# Patient Record
Sex: Male | Born: 1970 | Race: White | Hispanic: Refuse to answer | Marital: Single | Smoking: Former smoker
Health system: Southern US, Community
[De-identification: ages and names within clinical notes are randomized; demographics above are authoritative.]

## PROBLEM LIST (undated history)

## (undated) DIAGNOSIS — Z21 Asymptomatic human immunodeficiency virus [HIV] infection status: Secondary | ICD-10-CM

## (undated) DIAGNOSIS — E291 Testicular hypofunction: Secondary | ICD-10-CM

## (undated) DIAGNOSIS — B20 Human immunodeficiency virus [HIV] disease: Secondary | ICD-10-CM

## (undated) DIAGNOSIS — T7840XA Allergy, unspecified, initial encounter: Secondary | ICD-10-CM

## (undated) DIAGNOSIS — E785 Hyperlipidemia, unspecified: Secondary | ICD-10-CM

## (undated) HISTORY — DX: Human immunodeficiency virus (HIV) disease: B20

## (undated) HISTORY — DX: Hyperlipidemia, unspecified: E78.5

## (undated) HISTORY — DX: Asymptomatic human immunodeficiency virus (hiv) infection status: Z21

## (undated) HISTORY — DX: Testicular hypofunction: E29.1

## (undated) HISTORY — DX: Allergy, unspecified, initial encounter: T78.40XA

---

## 2007-02-21 ENCOUNTER — Ambulatory Visit: Payer: Self-pay | Admitting: Family Medicine

## 2007-02-24 ENCOUNTER — Ambulatory Visit: Payer: Self-pay | Admitting: Family Medicine

## 2007-06-01 ENCOUNTER — Ambulatory Visit: Payer: Self-pay | Admitting: Family Medicine

## 2007-07-04 ENCOUNTER — Ambulatory Visit: Payer: Self-pay | Admitting: Family Medicine

## 2007-12-11 ENCOUNTER — Ambulatory Visit: Payer: Self-pay | Admitting: Family Medicine

## 2008-01-03 ENCOUNTER — Ambulatory Visit: Payer: Self-pay | Admitting: Family Medicine

## 2008-07-19 ENCOUNTER — Ambulatory Visit: Payer: Self-pay | Admitting: Family Medicine

## 2009-08-07 ENCOUNTER — Ambulatory Visit: Payer: Self-pay | Admitting: Family Medicine

## 2010-06-04 ENCOUNTER — Ambulatory Visit: Admit: 2010-06-04 | Payer: Self-pay | Admitting: Family Medicine

## 2010-07-17 ENCOUNTER — Encounter (INDEPENDENT_AMBULATORY_CARE_PROVIDER_SITE_OTHER): Payer: BC Managed Care – PPO | Admitting: Family Medicine

## 2010-07-17 DIAGNOSIS — B20 Human immunodeficiency virus [HIV] disease: Secondary | ICD-10-CM

## 2010-07-17 DIAGNOSIS — Z0000 Encounter for general adult medical examination without abnormal findings: Secondary | ICD-10-CM

## 2010-07-17 DIAGNOSIS — J309 Allergic rhinitis, unspecified: Secondary | ICD-10-CM

## 2010-07-17 DIAGNOSIS — E291 Testicular hypofunction: Secondary | ICD-10-CM

## 2011-02-07 ENCOUNTER — Other Ambulatory Visit: Payer: Self-pay | Admitting: Family Medicine

## 2011-02-08 NOTE — Telephone Encounter (Signed)
Is this ok?

## 2011-03-10 ENCOUNTER — Other Ambulatory Visit: Payer: Self-pay | Admitting: Family Medicine

## 2011-03-10 NOTE — Telephone Encounter (Signed)
Patient was last seen on 07/17/10.

## 2011-03-10 NOTE — Telephone Encounter (Signed)
His medication was renewed but he needs an appointment

## 2011-03-12 NOTE — Telephone Encounter (Signed)
Pt called and Vernona Rieger set up an ov

## 2011-03-29 ENCOUNTER — Encounter: Payer: Self-pay | Admitting: Family Medicine

## 2011-03-30 ENCOUNTER — Encounter: Payer: Self-pay | Admitting: Family Medicine

## 2011-03-30 ENCOUNTER — Ambulatory Visit (INDEPENDENT_AMBULATORY_CARE_PROVIDER_SITE_OTHER): Payer: BC Managed Care – PPO | Admitting: Family Medicine

## 2011-03-30 VITALS — BP 130/84 | HR 60 | Wt 219.0 lb

## 2011-03-30 DIAGNOSIS — Z23 Encounter for immunization: Secondary | ICD-10-CM

## 2011-03-30 DIAGNOSIS — B20 Human immunodeficiency virus [HIV] disease: Secondary | ICD-10-CM | POA: Insufficient documentation

## 2011-03-30 DIAGNOSIS — Z566 Other physical and mental strain related to work: Secondary | ICD-10-CM

## 2011-03-30 DIAGNOSIS — Z5689 Other problems related to employment: Secondary | ICD-10-CM

## 2011-03-30 DIAGNOSIS — Z79899 Other long term (current) drug therapy: Secondary | ICD-10-CM

## 2011-03-30 DIAGNOSIS — Z209 Contact with and (suspected) exposure to unspecified communicable disease: Secondary | ICD-10-CM

## 2011-03-30 LAB — COMPREHENSIVE METABOLIC PANEL
CO2: 26 mEq/L (ref 19–32)
Calcium: 9.1 mg/dL (ref 8.4–10.5)
Chloride: 105 mEq/L (ref 96–112)
Creat: 0.91 mg/dL (ref 0.50–1.35)
Glucose, Bld: 104 mg/dL — ABNORMAL HIGH (ref 70–99)
Total Bilirubin: 0.2 mg/dL — ABNORMAL LOW (ref 0.3–1.2)
Total Protein: 6.5 g/dL (ref 6.0–8.3)

## 2011-03-30 NOTE — Progress Notes (Signed)
  Subjective:    Patient ID: Charles Ho, male    DOB: 05/15/1971, 40 y.o.   MRN: [id number]  HPI He is here for medication recheck. He continues on medications listed in the chart. He is doing fairly well on these medications. He also has concerns about STD exposure several months ago. He is having no discharge or dysuria. 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 At this time is not interested in coming off of his psychotropic medications. He has no other concerns or complaints specifically fever, chills, cough, congestion or weight change.   Review of Systems Negative except as above    Objective:   Physical Exam alert and in no distress. Tympanic membranes and canals are normal. Throat is clear. Tonsils are normal. Neck is supple without adenopathy or thyromegaly. Cardiac exam shows a regular sinus rhythm without murmurs or gallops. Lungs are clear to auscultation. Abdominal exam shows no masses or tenderness.        Assessment & Plan:   1. Human immunodeficiency virus (HIV) disease  T-helper cells (CD4) count, Comprehensive metabolic panel, HIV 1 RNA quant-no reflex-bld  2. Contact with or exposure to unspecified communicable disease  RPR  3. Stress at work    4. Encounter for long-term (current) use of other medications     Blood screening is listed above. We also discussed the stresses she is under and he seems to be handling this fairly well. Here in several months.

## 2011-03-31 LAB — T-HELPER CELLS (CD4) COUNT (NOT AT ARMC)
Absolute CD4: 1060 /uL (ref 381–1469)
CD4 T Helper %: 43 % (ref 32–62)

## 2011-04-01 LAB — HIV-1 RNA QUANT-NO REFLEX-BLD: HIV 1 RNA Quant: NOT DETECTED copies/mL (ref <20)

## 2011-04-11 ENCOUNTER — Other Ambulatory Visit: Payer: Self-pay | Admitting: Family Medicine

## 2011-04-12 NOTE — Telephone Encounter (Signed)
Is this okay?

## 2011-08-09 ENCOUNTER — Other Ambulatory Visit: Payer: Self-pay | Admitting: Family Medicine

## 2011-08-09 NOTE — Telephone Encounter (Signed)
Renew all of these

## 2011-08-09 NOTE — Telephone Encounter (Signed)
Is this ok?

## 2011-08-10 NOTE — Telephone Encounter (Signed)
Is this ok?

## 2011-10-28 ENCOUNTER — Telehealth: Payer: Self-pay | Admitting: Family Medicine

## 2011-10-28 NOTE — Telephone Encounter (Signed)
I SPOKE WITH PT AT THE PHARMACY & ADVISED OF ABOVE.  HE STATED HE HAS A BOTTLE FROM A COUPLE YEARS AGO THAT HE WAS USING.  ADVISED PT THAT I DIDN'T RECOMMEND HIM USING A INJECTION THAT WAS SEVERAL YEARS OLD & OFFERED AN APPT.  HE WILL CALL BACK NEXT WEEK & SCHEDULE APPT WITH JCL-LM

## 2012-02-03 ENCOUNTER — Other Ambulatory Visit: Payer: Self-pay | Admitting: Family Medicine

## 2012-02-04 ENCOUNTER — Telehealth: Payer: Self-pay | Admitting: Internal Medicine

## 2012-02-04 NOTE — Telephone Encounter (Signed)
Renew his medicine but make sure he has an appointment soon

## 2012-02-04 NOTE — Telephone Encounter (Signed)
Is this ok?

## 2012-02-04 NOTE — Telephone Encounter (Signed)
He needs to come in for an exam but keeping on his meds til then

## 2012-03-06 ENCOUNTER — Other Ambulatory Visit: Payer: Self-pay | Admitting: Family Medicine

## 2012-03-27 ENCOUNTER — Other Ambulatory Visit: Payer: Self-pay | Admitting: Family Medicine

## 2012-03-27 ENCOUNTER — Ambulatory Visit (INDEPENDENT_AMBULATORY_CARE_PROVIDER_SITE_OTHER): Payer: BC Managed Care – PPO | Admitting: Family Medicine

## 2012-03-27 ENCOUNTER — Other Ambulatory Visit: Payer: Self-pay

## 2012-03-27 ENCOUNTER — Encounter: Payer: Self-pay | Admitting: Family Medicine

## 2012-03-27 VITALS — BP 112/70 | HR 78 | Ht 70.0 in | Wt 224.0 lb

## 2012-03-27 DIAGNOSIS — B20 Human immunodeficiency virus [HIV] disease: Secondary | ICD-10-CM

## 2012-03-27 DIAGNOSIS — J309 Allergic rhinitis, unspecified: Secondary | ICD-10-CM | POA: Insufficient documentation

## 2012-03-27 DIAGNOSIS — F341 Dysthymic disorder: Secondary | ICD-10-CM

## 2012-03-27 DIAGNOSIS — E669 Obesity, unspecified: Secondary | ICD-10-CM

## 2012-03-27 DIAGNOSIS — Z23 Encounter for immunization: Secondary | ICD-10-CM

## 2012-03-27 DIAGNOSIS — Z0000 Encounter for general adult medical examination without abnormal findings: Secondary | ICD-10-CM

## 2012-03-27 DIAGNOSIS — Z79899 Other long term (current) drug therapy: Secondary | ICD-10-CM

## 2012-03-27 LAB — CBC WITH DIFFERENTIAL/PLATELET
Basophils Absolute: 0 10*3/uL (ref 0.0–0.1)
Basophils Relative: 1 % (ref 0–1)
Eosinophils Absolute: 0.3 10*3/uL (ref 0.0–0.7)
Eosinophils Relative: 5 % (ref 0–5)
Lymphocytes Relative: 46 % (ref 12–46)
MCH: 30.1 pg (ref 26.0–34.0)
MCV: 83.4 fL (ref 78.0–100.0)
Platelets: 200 10*3/uL (ref 150–400)
RDW: 12.6 % (ref 11.5–15.5)
WBC: 6.3 10*3/uL (ref 4.0–10.5)

## 2012-03-27 LAB — HEMOCCULT GUIAC POC 1CARD (OFFICE)

## 2012-03-27 LAB — COMPREHENSIVE METABOLIC PANEL
ALT: 28 U/L (ref 0–53)
AST: 21 U/L (ref 0–37)
Alkaline Phosphatase: 55 U/L (ref 39–117)
Creat: 0.99 mg/dL (ref 0.50–1.35)
Total Bilirubin: 0.3 mg/dL (ref 0.3–1.2)

## 2012-03-27 LAB — LIPID PANEL
HDL: 47 mg/dL (ref >39)
Total CHOL/HDL Ratio: 3.7 Ratio

## 2012-03-27 MED ORDER — FLUOXETINE HCL 20 MG PO CAPS: 20.0000 mg | ORAL_CAPSULE | Freq: Every day | ORAL | Status: DC

## 2012-03-27 MED ORDER — EMTRICITABINE-TENOFOVIR DF 200-300 MG PO TABS: 1.0000 | ORAL_TABLET | Freq: Every day | ORAL | Status: DC

## 2012-03-27 MED ORDER — EFAVIRENZ 600 MG PO TABS: 600.0000 mg | ORAL_TABLET | Freq: Every day | ORAL | Status: DC

## 2012-03-27 MED ORDER — FLUTICASONE PROPIONATE 50 MCG/ACT NA SUSP: 2.0000 | Freq: Every day | NASAL | Status: DC

## 2012-03-27 MED ORDER — BUPROPION HCL ER (XL) 300 MG PO TB24: 300.0000 mg | ORAL_TABLET | Freq: Every day | ORAL | Status: DC

## 2012-03-27 NOTE — Progress Notes (Signed)
Subjective:    Patient ID: Charles Ho, male    DOB: 04-05-1971, 41 y.o.   MRN: [id number]  HPI He is here for a complete examination. 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 He does work long hours and is very frustrated not with teaching but with the rules and regulations that he has to deal with. He has been on Prozac and Wellbutrin but recently stopped the Wellbutrin. He has not yet gotten involved in counseling. He continues on his HIV medications and is having no difficulty with them. His allergies are under good control.   Review of Systems  Constitutional: Negative.   HENT: Negative.   Respiratory: Negative.   Cardiovascular: Negative.   Gastrointestinal: Negative.   Genitourinary: Negative.   Musculoskeletal: Negative.   Skin: Negative.   Neurological: Negative.   Hematological: Negative.   Psychiatric/Behavioral: Positive for dysphoric mood.       Objective:   Physical Exam BP 112/70  Pulse 78  Ht 5\' 10"  (1.778 m)  Wt 224 lb (101.606 kg)  BMI 32.14 kg/m2  SpO2 99%  General Appearance:    Alert, cooperative, no distress, appears stated age  Head:    Normocephalic, without obvious abnormality, atraumatic  Eyes:    PERRL, conjunctiva/corneas clear, EOM's intact, fundi    benign  Ears:    Normal TM's and external ear canals  Nose:   Nares normal, mucosa normal, no drainage or sinus   tenderness  Throat:   Lips, mucosa, and tongue normal; teeth and gums normal  Neck:   Supple, no lymphadenopathy;  thyroid:  no   enlargement/tenderness/nodules; no carotid   bruit or JVD  Back:    Spine nontender, no curvature, ROM normal, no CVA     tenderness  Lungs:     Clear to auscultation bilaterally without wheezes, rales or     ronchi; respirations unlabored  Chest Wall:    No tenderness or deformity   Heart:    Regular rate and rhythm, S1 and S2 normal, no murmur, rub   or gallop  Breast Exam:    No chest wall tenderness, masses or gynecomastia  Abdomen:      Soft, non-tender, nondistended, normoactive bowel sounds,    no masses, no hepatosplenomegaly  Genitalia:   deferred   Rectal:    Normal sphincter tone, no masses or tenderness; guaiac negative stool.  Prostate smooth, no nodules, not enlarged.  Extremities:   No clubbing, cyanosis or edema  Pulses:   2+ and symmetric all extremities  Skin:   Skin color, texture, turgor normal, no rashes or lesions  Lymph nodes:   Cervical, supraclavicular, and axillary nodes normal  Neurologic:   CNII-XII intact, normal strength, sensation and gait; reflexes 2+ and symmetric throughout          Psych:   Normal mood, affect, hygiene and grooming.           Assessment & Plan:   1. Routine general medical examination at a health care facility  Flu vaccine greater than or equal to 3yo preservative free IM  2. Human immunodeficiency virus (HIV) disease  HIV 1 RNA quant-no reflex-bld, T-helper cells (CD4) count, emtricitabine-tenofovir (TRUVADA) 200-300 MG per tablet, efavirenz (SUSTIVA) 600 MG tablet  3. Obesity (BMI 30-39.9)    4. Dysthymia  buPROPion (WELLBUTRIN XL) 300 MG 24 hr tablet, FLUoxetine (PROZAC) 20 MG capsule  5. Allergic rhinitis, mild  fluticasone (FLONASE) 50 MCG/ACT nasal spray  6. Encounter for long-term (current)  use of other medications  Lipid panel, CBC with Differential, Comprehensive metabolic panel, HIV 1 RNA quant-no reflex-bld, T-helper cells (CD4) count   I strongly encouraged him to get involved in counseling to better handle the stress and he is experiencing at work. Also gave him a flu shot with a discussion of risks and benefits. I will place him on a higher dose of Wellbutrin. He is to return here in one month for recheck.

## 2012-03-27 NOTE — Patient Instructions (Signed)
It's time to get involved in counseling

## 2012-03-28 LAB — T-HELPER CELLS (CD4) COUNT (NOT AT ARMC)
Absolute CD4: 1217 /uL (ref 381–1469)
CD4 T Helper %: 42 % (ref 32–62)
Total Lymphocyte: 46 % (ref 12–46)
Total lymphocyte count: 2898 /uL (ref 700–3300)
WBC, lymph enumeration: 6.3 10*3/uL (ref 4.0–10.5)

## 2012-03-30 ENCOUNTER — Telehealth: Payer: Self-pay

## 2012-03-30 DIAGNOSIS — E291 Testicular hypofunction: Secondary | ICD-10-CM

## 2012-03-30 NOTE — Telephone Encounter (Signed)
Pt called very upset rude and emotional to get his results said he was hung up the first time I gave him results he was very upset his testosteron was not checked and wanted to know why he rambled on and on fussing about everything I told him that I would send you a note and go from there spent 16 min on phone with him

## 2012-05-30 ENCOUNTER — Telehealth: Payer: Self-pay | Admitting: Family Medicine

## 2012-05-30 DIAGNOSIS — F341 Dysthymic disorder: Secondary | ICD-10-CM

## 2012-05-30 MED ORDER — BUPROPION HCL ER (XL) 300 MG PO TB24: 300.0000 mg | ORAL_TABLET | Freq: Every day | ORAL | Status: DC

## 2012-05-30 NOTE — Telephone Encounter (Signed)
PT CALLED AND STATED THAT NEW DOSE OF WELLBUTRIN IS WORKING VERY WELL. NEEDS REFILL OF 300 MG SENT TO [facility] [address].

## 2012-05-30 NOTE — Telephone Encounter (Signed)
He states that the 300 mg dosing is working quite well. He would like refills on this.

## 2012-06-13 ENCOUNTER — Encounter: Payer: Self-pay | Admitting: Family Medicine

## 2012-06-13 ENCOUNTER — Ambulatory Visit (INDEPENDENT_AMBULATORY_CARE_PROVIDER_SITE_OTHER): Payer: BC Managed Care – PPO | Admitting: Family Medicine

## 2012-06-13 VITALS — BP 130/90 | Temp 99.1°F | Ht 71.0 in | Wt 222.0 lb

## 2012-06-13 DIAGNOSIS — L309 Dermatitis, unspecified: Secondary | ICD-10-CM

## 2012-06-13 DIAGNOSIS — L259 Unspecified contact dermatitis, unspecified cause: Secondary | ICD-10-CM

## 2012-06-13 NOTE — Patient Instructions (Addendum)
  1% Hydrocortisone 2-3 x/day as needed for itching. Aveeno oatmeal baths as needed Ok to continue OTC med Benadryl at bedtime if needed for itching.  Continue claritin Drink plenty of fluids and keep skin well moisturized.

## 2012-06-13 NOTE — Progress Notes (Signed)
Chief Complaint  Patient presents with  . Rash    x 1 week. Has students with known bed bugs. Very itchy rash knees, ankles, arms and abdomen. States that his penis looks "spotty."   Itching started about 10 days ago, first noted around ankles, then thighs.  Now also itchy on abdomen, arms, thighs.  A few small bumps are noted.  He has also noted spots on penis x few days--not itchy, not painful. No dysuria. This is causing him significant anxiety. He has HIV, but not in a sexual relationship.  No change in detergents, soaps, products.  Anti-itch cream from [employer] (diphenhydramine and zinc) which he has used a few times and was very helpful.  Using ice packs (frozen vegetables) on thighs to help with itching. Takes claritin daily for allergies.  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 No other exposures to others with rashes.  He is known to be allergic to poison ivy, but hasn't been in garden.  Past Medical History  Diagnosis Date  . Hypogonadism, male   . Allergy     RHINITIS  . Dyslipidemia   . HIV infection    History reviewed. No pertinent past surgical history. History   Social History  . Marital Status: Single    Spouse Name: N/A    Number of Children: N/A  . Years of Education: N/A   Occupational History  . Not on file.   Social History Main Topics  . Smoking status: Never Smoker   . Smokeless tobacco: Not on file  . Alcohol Use: No  . Drug Use: No  . Sexually Active: Not Currently   Other Topics Concern  . Not on file   Social History Narrative   1st grade teacher at [employer]   Current Outpatient Prescriptions on File Prior to Visit  Medication Sig Dispense Refill  . Ascorbic Acid (VITAMIN C) 1000 MG tablet Take 1,000 mg by mouth daily.        Marland Kitchen buPROPion (WELLBUTRIN XL) 300 MG 24 hr tablet Take 1 tablet (300 mg total) by mouth daily.  30 tablet  5  . cholecalciferol (VITAMIN D) 1000 UNITS tablet Take 1,000 Units by mouth daily.        Marland Kitchen efavirenz (SUSTIVA) 600 MG tablet Take 1 tablet (600 mg total) by mouth at bedtime.  30 tablet  5  . emtricitabine-tenofovir (TRUVADA) 200-300 MG per tablet Take 1 tablet by mouth daily.  30 tablet  5  . FLUoxetine (PROZAC) 20 MG capsule Take 1 capsule (20 mg total) by mouth daily.  30 capsule  11  . fluticasone (FLONASE) 50 MCG/ACT nasal spray Place 2 sprays into the nose daily.  16 g  PRN  . Loratadine 10 MG CAPS Take 1 capsule by mouth daily.      . Probiotic Product (PROBIOTIC & ACIDOPHILUS EX ST) CAPS Take by mouth.        Marland Kitchen ibuprofen (ADVIL,MOTRIN) 200 MG tablet Take 200 mg by mouth every 6 (six) hours as needed.         No Known Allergies  ROS:  Denies fevers, nausea, vomiting or diarrhea. No dysuria. No other skin rashes, bleeding or bruising. Denies pain. +anxious about rash on penis.  No chest pain, URI symptoms, shortness of breath, bleeding/bruising or other concerns  PHYSICAL EXAM: BP 130/90  Temp 99.1 F (37.3 C) (Oral)  Ht 5\' 11"  (1.803 m)  Wt 222 lb (100.699 kg)  BMI 30.96 kg/m2  Anxious-appearing, very pleasant male in no distress Skin: vertical linear excoriations on L abdomen.  A few fine papules on abdomen. Small punctate lesions (<65mm papules and macules) on thighs, none really on stomach.  2-3 noted at ankles, and a few other scattered areas. Head of penis shows some papules.  No erythema, weeping, penile discharge, inguinal adenopathy. Shaft is normal, without lesions  ASSESSMENT/PLAN: 1. Dermatitis    prurirtis and mild rash.  Some of rash looks follicular, but not all (definitely not penile lesions). Scratching is likely exacerbating.   Rash is overall nonspecific, even penile rash.  1% HC 2-3 x/day as needed for itching. Aveeno oatmeal baths as needed Ok to continue OTC med Benadryl at bedtime if needed for itching.  Continue claritin Drink plenty of fluids and keep skin well moisturized.  Return for re-evaluation if rash persists/worsens

## 2012-10-07 ENCOUNTER — Other Ambulatory Visit: Payer: Self-pay | Admitting: Family Medicine

## 2012-10-27 ENCOUNTER — Encounter: Payer: Self-pay | Admitting: Medical

## 2012-10-27 ENCOUNTER — Ambulatory Visit (INDEPENDENT_AMBULATORY_CARE_PROVIDER_SITE_OTHER): Payer: BC Managed Care – PPO | Admitting: Medical

## 2012-10-27 VITALS — BP 100/70 | HR 70 | Temp 97.5°F | Resp 16 | Wt 214.0 lb

## 2012-10-27 DIAGNOSIS — J069 Acute upper respiratory infection, unspecified: Secondary | ICD-10-CM

## 2012-10-27 DIAGNOSIS — M65839 Other synovitis and tenosynovitis, unspecified forearm: Secondary | ICD-10-CM

## 2012-10-27 DIAGNOSIS — M65849 Other synovitis and tenosynovitis, unspecified hand: Secondary | ICD-10-CM

## 2012-10-27 MED ORDER — AMOXICILLIN 875 MG PO TABS: 875.0000 mg | ORAL_TABLET | Freq: Two times a day (BID) | ORAL | Status: DC

## 2012-10-27 NOTE — Patient Instructions (Signed)
Respiratory infection   Begin Mucinex or Mucinex DM x 5 days  Drink plenty of water  Rest  If not improving or if getting worse (fever, brown/green mucous, worse overall), then begin Amoxicillin  Forearm extensor tendonitis   Rest  Ice to the forearm and elbow  Ibuprofen OTC 2-3 tablets two to three times daily for a week or so  Consider OTC Tennis elbow strap  Gradually return to normal activity after a week

## 2012-10-27 NOTE — Progress Notes (Signed)
Subjective:  Charles Ho is a 42 y.o. male with HIV who presents for illness and cough x 3 wk, coworker contact sick with bronchitis.  Cough initially mild, then got intense the 2nd week, but now just intermittent.  Started with productive cough with heavy yellow mucous, but is more pale color now.  He reports headache, sore throat, swollen glands.  Does have fatigue, chest congestion.  Denies fever, chills, nausea, vomiting, diarrhea, sinus pressure, no nasal drainage, no ear popping.  Using Vit C and more sleep with some relief.  Using Ibuprofen periodically. Patient is a non-smoker. He does have allergies, uses Claritin and Flonase daily.  He denies hx/o bronchitis.  He also notes pain in right forearm. He is right handed. right forearm pain x 29mo, worsening.  Thinks its worse due to doing yard work and mowing.  When grasping with the arm, gets sharp and achy pain with grasping and lifting things with right hand.  No weakness, no numbness.  Mainly just pain, no swelling.  Using nothing for this.  Trying to rest the arm.  No other aggravating or relieving factors.    No other c/o.  Objective: Filed Vitals:   10/27/12 1335  BP: 100/70  Pulse: 70  Temp: 97.5 F (36.4 C)  Resp: 16    General appearance: Alert, WD/WN, no distress                             Skin: warm, no rash                           Head: no sinus tenderness                            Eyes: conjunctiva normal, corneas clear, PERRLA                            Ears: flat TMs,left TM with some erythema, external ear canals normal                          Nose: septum midline, turbinates with erythema, clear discharge             Mouth/throat: MMM, tongue normal, mild pharyngeal erythema                           Neck: supple, no adenopathy, no thyromegaly, nontender                          Heart: RRR, normal S1, S2, no murmurs                         Lungs: CTA bilaterally, no wheezes, rales, or rhonchi   MSK: tender  right forearm and lateral epicondyle, pain with wrist extension, otherwise no swelling, no obvious deformity, rest of UE unremarkable Neurovascularly intact   Assessment and Plan:   Encounter Diagnoses  Name Primary?  Marland Kitchen Upper respiratory infection Yes  . Forearm tendonitis    Patient Instructions  Respiratory infection   Begin Mucinex or Mucinex DM x 5 days  Drink plenty of water  Rest  If not improving or if getting worse (fever, brown/green mucous, worse overall), then  begin Amoxicillin  Forearm extensor tendonitis   Rest  Ice to the forearm and elbow  Ibuprofen OTC 2-3 tablets two to three times daily for a week or so  Consider OTC Tennis elbow strap  Gradually return to normal activity after a week

## 2012-12-18 ENCOUNTER — Other Ambulatory Visit: Payer: Self-pay | Admitting: Family Medicine

## 2012-12-19 NOTE — Telephone Encounter (Signed)
Wellbutrin renewed. He will be called to set up an appointment for followup

## 2012-12-19 NOTE — Telephone Encounter (Signed)
IS THIS OK 

## 2012-12-19 NOTE — Telephone Encounter (Signed)
LEFT WORD FOR WORD MESSAGE  

## 2012-12-19 NOTE — Telephone Encounter (Signed)
Let him know that I called in the Wellbutrin but he needs a followup appointment for med check

## 2013-03-27 ENCOUNTER — Ambulatory Visit (INDEPENDENT_AMBULATORY_CARE_PROVIDER_SITE_OTHER): Payer: BC Managed Care – PPO | Admitting: Family Medicine

## 2013-03-27 ENCOUNTER — Encounter: Payer: Self-pay | Admitting: Family Medicine

## 2013-03-27 VITALS — BP 122/80 | HR 62 | Temp 98.5°F | Resp 16 | Wt 218.0 lb

## 2013-03-27 DIAGNOSIS — Z79899 Other long term (current) drug therapy: Secondary | ICD-10-CM

## 2013-03-27 DIAGNOSIS — J309 Allergic rhinitis, unspecified: Secondary | ICD-10-CM

## 2013-03-27 DIAGNOSIS — G44209 Tension-type headache, unspecified, not intractable: Secondary | ICD-10-CM

## 2013-03-27 DIAGNOSIS — B20 Human immunodeficiency virus [HIV] disease: Secondary | ICD-10-CM

## 2013-03-27 DIAGNOSIS — F341 Dysthymic disorder: Secondary | ICD-10-CM

## 2013-03-27 DIAGNOSIS — E669 Obesity, unspecified: Secondary | ICD-10-CM

## 2013-03-27 DIAGNOSIS — Z23 Encounter for immunization: Secondary | ICD-10-CM

## 2013-03-27 LAB — CBC WITH DIFFERENTIAL/PLATELET
Basophils Absolute: 0.1 10*3/uL (ref 0.0–0.1)
Eosinophils Relative: 39 % — ABNORMAL HIGH (ref 0–5)
HCT: 42.7 % (ref 39.0–52.0)
Hemoglobin: 15.1 g/dL (ref 13.0–17.0)
Lymphocytes Relative: 32 % (ref 12–46)
Lymphs Abs: 3.2 10*3/uL (ref 0.7–4.0)
MCV: 87.1 fL (ref 78.0–100.0)
Monocytes Absolute: 0.7 10*3/uL (ref 0.1–1.0)
Neutro Abs: 2.2 10*3/uL (ref 1.7–7.7)
Platelets: 203 10*3/uL (ref 150–400)
RBC: 4.9 MIL/uL (ref 4.22–5.81)
RDW: 12.4 % (ref 11.5–15.5)
WBC: 10 10*3/uL (ref 4.0–10.5)

## 2013-03-27 LAB — COMPREHENSIVE METABOLIC PANEL
Alkaline Phosphatase: 68 U/L (ref 39–117)
CO2: 29 mEq/L (ref 19–32)
Chloride: 102 mEq/L (ref 96–112)
Potassium: 4.5 mEq/L (ref 3.5–5.3)
Sodium: 137 mEq/L (ref 135–145)
Total Bilirubin: 0.3 mg/dL (ref 0.3–1.2)
Total Protein: 6.3 g/dL (ref 6.0–8.3)

## 2013-03-27 LAB — LIPID PANEL
HDL: 45 mg/dL (ref >39)
LDL Cholesterol: 135 mg/dL — ABNORMAL HIGH (ref 0–99)
Total CHOL/HDL Ratio: 4.4 Ratio
Triglycerides: 87 mg/dL (ref <150)
VLDL: 17 mg/dL (ref 0–40)

## 2013-03-27 MED ORDER — FLUTICASONE PROPIONATE 50 MCG/ACT NA SUSP: 2.0000 | Freq: Every day | NASAL | Status: DC

## 2013-03-27 MED ORDER — BUPROPION HCL ER (XL) 300 MG PO TB24: ORAL_TABLET | ORAL | Status: DC

## 2013-03-27 MED ORDER — FLUOXETINE HCL 20 MG PO CAPS: 20.0000 mg | ORAL_CAPSULE | Freq: Every day | ORAL | Status: DC

## 2013-03-27 MED ORDER — EFAVIRENZ 600 MG PO TABS: ORAL_TABLET | ORAL | Status: DC

## 2013-03-27 MED ORDER — EMTRICITABINE-TENOFOVIR DF 200-300 MG PO TABS: ORAL_TABLET | ORAL | Status: DC

## 2013-03-27 NOTE — Progress Notes (Signed)
  Subjective:    Patient ID: Charles Ho, male    DOB: 1971/01/23, 42 y.o.   MRN: [id number]  HPI Is here for medication check. 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 He has noted over the last 10 weeks the onset of headaches he describes as pressure-like and usually occurs in the occipital area no blurred or double vision. He describes the headaches as occurring practically every day except towards the end of the week. But more likely on Sunday and Monday. He has been using he'll do which he says helps. He takes Excedrin extra strength to get helps. He states he also has low-grade nausea that he's had for several weeks and again blames this on stress. He continues on his HIV medications and is having no trouble with that. He seems to be doing fairly well on his Prozac and Wellbutrin. His allergies are under good control.  Review of Systems     Objective:   Physical Exam alert and in no distress. Tympanic membranes and canals are normal. Throat is clear. Tonsils are normal. Neck is supple without adenopathy or thyromegaly. Cardiac exam shows a regular sinus rhythm without murmurs or gallops. Lungs are clear to auscultation. Abdominal exam shows no masses or tenderness.       Assessment & Plan:  Obesity (BMI 30-39.9) - Plan: CBC with Differential, Comprehensive metabolic panel, Lipid panel  Dysthymia - Plan: FLUoxetine (PROZAC) 20 MG capsule, buPROPion (WELLBUTRIN XL) 300 MG 24 hr tablet  Allergic rhinitis, mild - Plan: fluticasone (FLONASE) 50 MCG/ACT nasal spray  Human immunodeficiency virus (HIV) disease - Plan: emtricitabine-tenofovir (TRUVADA) 200-300 MG per tablet, efavirenz (SUSTIVA) 600 MG tablet, CBC with Differential, Comprehensive metabolic panel, Lipid panel, T-helper cells (CD4) count, HIV 1 RNA quant-no reflex-bld, HIV 1 RNA quant-no reflex-bld, CANCELED: HIV antibody  Tension headache  Encounter for long-term  (current) use of other medications  Need for prophylactic vaccination and inoculation against influenza - Plan: Flu Vaccine QUAD 36+ mos IM  he is under less stress due to work conditions. We discussed this in detail. Encouraged him to potentially look for another job as well as tighter deal of stress in a much more positive manner. Discussed the need to use over-the-counter medications for his tension headaches. He'll continue on his allergy medications as well as Prozac and Wellbutrin.

## 2013-03-28 LAB — HIV-1 RNA QUANT-NO REFLEX-BLD
HIV 1 RNA Quant: <20 copies/mL (ref <20)
HIV-1 RNA Quant, Log: <1.3 {Log} (ref <1.30)

## 2013-03-29 NOTE — Progress Notes (Signed)
Quick Note:  MAILED PT LETTER OF RESULTS ______

## 2013-03-29 NOTE — Progress Notes (Signed)
Quick Note:  LEFT MESSAGE TO CALL ME BACK ______ 

## 2013-05-16 ENCOUNTER — Telehealth: Payer: Self-pay | Admitting: Family Medicine

## 2013-05-16 NOTE — Telephone Encounter (Signed)
Let him know that this sounds like a viral infection and have him treat his symptoms. He can use NyQuil at night

## 2013-05-16 NOTE — Telephone Encounter (Signed)
PT SAID HE WAS COMING BACK HOME CANT BE AROUND SISTER AND HE HAS ANTIBIOTICS AT HOME AND FYI HE WILL BE SPENDING [facility]STM[facility] ALONE

## 2013-05-16 NOTE — Telephone Encounter (Signed)
Pt called again and just wanted to see if anything had been done yet? If you need patient you can call on his parents phone [phone]

## 2014-03-26 ENCOUNTER — Other Ambulatory Visit: Payer: Self-pay

## 2014-03-26 ENCOUNTER — Telehealth: Payer: Self-pay | Admitting: Family Medicine

## 2014-03-26 DIAGNOSIS — F341 Dysthymic disorder: Secondary | ICD-10-CM

## 2014-03-26 DIAGNOSIS — J309 Allergic rhinitis, unspecified: Secondary | ICD-10-CM

## 2014-03-26 DIAGNOSIS — B20 Human immunodeficiency virus [HIV] disease: Secondary | ICD-10-CM

## 2014-03-26 MED ORDER — EFAVIRENZ 600 MG PO TABS: ORAL_TABLET | ORAL | Status: DC

## 2014-03-26 MED ORDER — BUPROPION HCL ER (XL) 300 MG PO TB24: ORAL_TABLET | ORAL | Status: DC

## 2014-03-26 MED ORDER — EMTRICITABINE-TENOFOVIR DF 200-300 MG PO TABS: ORAL_TABLET | ORAL | Status: DC

## 2014-03-26 MED ORDER — FLUOXETINE HCL 20 MG PO CAPS: 20.0000 mg | ORAL_CAPSULE | Freq: Every day | ORAL | Status: DC

## 2014-03-26 MED ORDER — FLUTICASONE PROPIONATE 50 MCG/ACT NA SUSP: 2.0000 | Freq: Every day | NASAL | Status: DC

## 2014-03-26 NOTE — Telephone Encounter (Signed)
Pt made a cpe appt for first of dec. Needs meds filled will be out before then. Pt uses [facility] on [address].

## 2014-03-26 NOTE — Telephone Encounter (Signed)
Done

## 2014-04-29 ENCOUNTER — Encounter: Payer: Self-pay | Admitting: Family Medicine

## 2014-04-29 ENCOUNTER — Ambulatory Visit (INDEPENDENT_AMBULATORY_CARE_PROVIDER_SITE_OTHER): Payer: BC Managed Care – PPO | Admitting: Family Medicine

## 2014-04-29 VITALS — BP 128/84 | HR 80 | Ht 70.0 in | Wt 230.0 lb

## 2014-04-29 DIAGNOSIS — B20 Human immunodeficiency virus [HIV] disease: Secondary | ICD-10-CM

## 2014-04-29 DIAGNOSIS — J309 Allergic rhinitis, unspecified: Secondary | ICD-10-CM

## 2014-04-29 DIAGNOSIS — Z0000 Encounter for general adult medical examination without abnormal findings: Secondary | ICD-10-CM

## 2014-04-29 DIAGNOSIS — F341 Dysthymic disorder: Secondary | ICD-10-CM

## 2014-04-29 DIAGNOSIS — Z23 Encounter for immunization: Secondary | ICD-10-CM

## 2014-04-29 DIAGNOSIS — Z209 Contact with and (suspected) exposure to unspecified communicable disease: Secondary | ICD-10-CM

## 2014-04-29 DIAGNOSIS — E669 Obesity, unspecified: Secondary | ICD-10-CM

## 2014-04-29 LAB — COMPREHENSIVE METABOLIC PANEL
ALT: 26 U/L (ref 0–53)
AST: 21 U/L (ref 0–37)
Albumin: 4.6 g/dL (ref 3.5–5.2)
Alkaline Phosphatase: 64 U/L (ref 39–117)
BUN: 11 mg/dL (ref 6–23)
CHLORIDE: 103 meq/L (ref 96–112)
CO2: 25 mEq/L (ref 19–32)
CREATININE: 1.13 mg/dL (ref 0.50–1.35)
Calcium: 9.3 mg/dL (ref 8.4–10.5)
Glucose, Bld: 95 mg/dL (ref 70–99)
Potassium: 4 mEq/L (ref 3.5–5.3)
Sodium: 140 mEq/L (ref 135–145)
Total Bilirubin: 0.4 mg/dL (ref 0.2–1.2)
Total Protein: 6.8 g/dL (ref 6.0–8.3)

## 2014-04-29 LAB — CBC WITH DIFFERENTIAL/PLATELET
BASOS ABS: 0 10*3/uL (ref 0.0–0.1)
Basophils Relative: 0 % (ref 0–1)
EOS ABS: 0.2 10*3/uL (ref 0.0–0.7)
EOS PCT: 2 % (ref 0–5)
HCT: 42.1 % (ref 39.0–52.0)
Hemoglobin: 14.8 g/dL (ref 13.0–17.0)
Lymphocytes Relative: 25 % (ref 12–46)
Lymphs Abs: 2.7 10*3/uL (ref 0.7–4.0)
MCH: 30.8 pg (ref 26.0–34.0)
MCHC: 35.2 g/dL (ref 30.0–36.0)
MCV: 87.5 fL (ref 78.0–100.0)
MONO ABS: 1 10*3/uL (ref 0.1–1.0)
MPV: 10.1 fL (ref 9.4–12.4)
Monocytes Relative: 9 % (ref 3–12)
Neutro Abs: 6.8 10*3/uL (ref 1.7–7.7)
Neutrophils Relative %: 64 % (ref 43–77)
PLATELETS: 207 10*3/uL (ref 150–400)
RBC: 4.81 MIL/uL (ref 4.22–5.81)
RDW: 12.3 % (ref 11.5–15.5)
WBC: 10.7 10*3/uL — ABNORMAL HIGH (ref 4.0–10.5)

## 2014-04-29 LAB — LIPID PANEL
CHOL/HDL RATIO: 3.6 ratio
CHOLESTEROL: 178 mg/dL (ref 0–200)
HDL: 49 mg/dL (ref >39)
LDL Cholesterol: 113 mg/dL — ABNORMAL HIGH (ref 0–99)
Triglycerides: 79 mg/dL (ref <150)
VLDL: 16 mg/dL (ref 0–40)

## 2014-04-29 MED ORDER — FLUOXETINE HCL 20 MG PO CAPS: 20.0000 mg | ORAL_CAPSULE | Freq: Every day | ORAL | Status: DC

## 2014-04-29 MED ORDER — EMTRICITABINE-TENOFOVIR DF 200-300 MG PO TABS: ORAL_TABLET | ORAL | Status: DC

## 2014-04-29 MED ORDER — EFAVIRENZ 600 MG PO TABS
ORAL_TABLET | ORAL | Status: DC
Start: 2014-04-29 — End: 2015-04-16

## 2014-04-29 MED ORDER — BUPROPION HCL ER (XL) 300 MG PO TB24: ORAL_TABLET | ORAL | Status: DC

## 2014-04-29 NOTE — Progress Notes (Deleted)
Subjective:     Patient ID: Charles ChessMichael D Candelas, male   DOB: February 01, 1971, 43 y.o.   MRN: [id number]  HPI   Review of Systems     Objective:   Physical Exam     Assessment:     ***    Plan:     ***

## 2014-04-29 NOTE — Progress Notes (Signed)
   Subjective:    Patient ID: Charles ChessMichael D Ringold, male    DOB: Feb 11, 1971, 43 y.o.   MRN: [id number]  HPI He is here for complete examination. He continues on medications listed in the chart. He rarely misses any of his medications. His allergies are under good control. He is doing well on bupropion and Prozac. Work is quite stressful but in general he seems to be psychologically doing fairly well. He is doing well on his HIV medications. He has become more socially active and apparently has had a few dates over the last year. His family and social history are unchanged. Immunizations and health maintenance was reviewed.    Review of Systems  All other systems reviewed and are negative.      Objective:   Physical Exam BP 128/84 mmHg  Pulse 80  Ht 5\' 10"  (1.778 m)  Wt 230 lb (104.327 kg)  BMI 33.00 kg/m2  SpO2 97%  General Appearance:    Alert, cooperative, no distress, appears stated age  Head:    Normocephalic, without obvious abnormality, atraumatic  Eyes:    PERRL, conjunctiva/corneas clear, EOM's intact, fundi    benign  Ears:    Normal TM's and external ear canals  Nose:   Nares normal, mucosa normal, no drainage or sinus   tenderness  Throat:   Lips, mucosa, and tongue normal; teeth and gums normal  Neck:   Supple, no lymphadenopathy;  thyroid:  no   enlargement/tenderness/nodules; no carotid   bruit or JVD  Back:    Spine nontender, no curvature, ROM normal, no CVA     tenderness  Lungs:     Clear to auscultation bilaterally without wheezes, rales or     ronchi; respirations unlabored  Chest Wall:    No tenderness or deformity   Heart:    Regular rate and rhythm, S1 and S2 normal, no murmur, rub   or gallop  Breast Exam:    No chest wall tenderness, masses or gynecomastia  Abdomen:     Soft, non-tender, nondistended, normoactive bowel sounds,    no masses, no hepatosplenomegaly  Genitalia:  deferred  Rectal:  deferred  Extremities:   No clubbing, cyanosis or edema    Pulses:   2+ and symmetric all extremities  Skin:   Skin color, texture, turgor normal, no rashes or lesions  Lymph nodes:   Cervical, supraclavicular, and axillary nodes normal  Neurologic:   CNII-XII intact, normal strength, sensation and gait; reflexes 2+ and symmetric throughout          Psych:   Normal mood, affect, hygiene and grooming.         Assessment & Plan:  Routine general medical examination at a health care facility - Plan: CBC with Differential, Comprehensive metabolic panel, Lipid panel, Hepatitis B surface antibody, Hepatitis C Antibody  Allergic rhinitis, mild  Dysthymia - Plan: buPROPion (WELLBUTRIN XL) 300 MG 24 hr tablet, FLUoxetine (PROZAC) 20 MG capsule  Human immunodeficiency virus (HIV) disease - Plan: HIV 1 RNA quant-no reflex-bld, T-helper cells (CD4) count, efavirenz (SUSTIVA) 600 MG tablet, emtricitabine-tenofovir (TRUVADA) 200-300 MG per tablet  Obesity (BMI 30-39.9)  Contact with or exposure to communicable disease - Plan: RPR  Need for prophylactic vaccination and inoculation against influenza - Plan: Flu Vaccine QUAD 36+ mos IM  I discussed the stress that he is under in regard to work. He seems to be handling this fairly well. He will continue on his present medication regimen.

## 2014-04-30 LAB — T-HELPER CELLS (CD4) COUNT (NOT AT ARMC)
Absolute CD4: 1150 /uL (ref 381–1469)
CD4 T Helper %: 43 % (ref 32–62)
Total Lymphocyte: 25 % (ref 12–46)
Total lymphocyte count: 2675 /uL (ref 700–3300)
WBC, lymph enumeration: 10.7 10*3/uL — ABNORMAL HIGH (ref 4.0–10.5)

## 2014-04-30 LAB — RPR

## 2014-04-30 LAB — HEPATITIS C ANTIBODY: HCV AB: NEGATIVE

## 2014-04-30 LAB — HEPATITIS B SURFACE ANTIBODY, QUANTITATIVE: Hepatitis B-Post: 0 m[IU]/mL

## 2014-05-01 LAB — HIV-1 RNA QUANT-NO REFLEX-BLD
HIV 1 RNA QUANT: 106 {copies}/mL — AB (ref <20)
HIV-1 RNA Quant, Log: 2.03 {Log} — ABNORMAL HIGH (ref <1.30)

## 2015-03-19 ENCOUNTER — Other Ambulatory Visit: Payer: Self-pay | Admitting: Family Medicine

## 2015-03-20 NOTE — Telephone Encounter (Signed)
Left message for him to call back and schedule annual.

## 2015-03-20 NOTE — Telephone Encounter (Signed)
He needs an appointment but don't let him run out of medicine

## 2015-03-20 NOTE — Telephone Encounter (Signed)
Is this ok to refill?  

## 2015-04-16 ENCOUNTER — Other Ambulatory Visit: Payer: Self-pay | Admitting: Family Medicine

## 2015-04-16 NOTE — Telephone Encounter (Signed)
Take care of this 

## 2015-04-16 NOTE — Telephone Encounter (Signed)
Don't let him run out of medication but he needs an appointment

## 2015-04-16 NOTE — Telephone Encounter (Signed)
Is this ok to refill?  

## 2015-05-01 ENCOUNTER — Ambulatory Visit (INDEPENDENT_AMBULATORY_CARE_PROVIDER_SITE_OTHER): Payer: BC Managed Care – PPO | Admitting: Family Medicine

## 2015-05-01 ENCOUNTER — Encounter: Payer: Self-pay | Admitting: Family Medicine

## 2015-05-01 VITALS — BP 114/82 | HR 72 | Temp 99.4°F | Ht 71.0 in | Wt 227.2 lb

## 2015-05-01 DIAGNOSIS — B20 Human immunodeficiency virus [HIV] disease: Secondary | ICD-10-CM

## 2015-05-01 DIAGNOSIS — J309 Allergic rhinitis, unspecified: Secondary | ICD-10-CM | POA: Diagnosis not present

## 2015-05-01 DIAGNOSIS — F341 Dysthymic disorder: Secondary | ICD-10-CM

## 2015-05-01 DIAGNOSIS — Z23 Encounter for immunization: Secondary | ICD-10-CM | POA: Diagnosis not present

## 2015-05-01 DIAGNOSIS — E669 Obesity, unspecified: Secondary | ICD-10-CM | POA: Diagnosis not present

## 2015-05-01 LAB — CBC WITH DIFFERENTIAL/PLATELET
BASOS ABS: 0.1 10*3/uL (ref 0.0–0.1)
Basophils Relative: 1 % (ref 0–1)
EOS ABS: 0.3 10*3/uL (ref 0.0–0.7)
EOS PCT: 6 % — AB (ref 0–5)
HEMATOCRIT: 42.3 % (ref 39.0–52.0)
Hemoglobin: 14.7 g/dL (ref 13.0–17.0)
LYMPHS PCT: 44 % (ref 12–46)
Lymphs Abs: 2.6 10*3/uL (ref 0.7–4.0)
MCH: 30.6 pg (ref 26.0–34.0)
MCHC: 34.8 g/dL (ref 30.0–36.0)
MCV: 87.9 fL (ref 78.0–100.0)
MONO ABS: 0.6 10*3/uL (ref 0.1–1.0)
MPV: 9.9 fL (ref 8.6–12.4)
Monocytes Relative: 10 % (ref 3–12)
Neutro Abs: 2.3 10*3/uL (ref 1.7–7.7)
Neutrophils Relative %: 39 % — ABNORMAL LOW (ref 43–77)
PLATELETS: 226 10*3/uL (ref 150–400)
RBC: 4.81 MIL/uL (ref 4.22–5.81)
RDW: 12.4 % (ref 11.5–15.5)
WBC: 5.8 10*3/uL (ref 4.0–10.5)

## 2015-05-01 LAB — POCT URINALYSIS DIPSTICK
Bilirubin, UA: NEGATIVE
GLUCOSE UA: NEGATIVE
Ketones, UA: NEGATIVE
Leukocytes, UA: NEGATIVE
NITRITE UA: NEGATIVE
PH UA: 6.5
Protein, UA: NEGATIVE
RBC UA: NEGATIVE
SPEC GRAV UA: 1.025
UROBILINOGEN UA: 4

## 2015-05-01 MED ORDER — EMTRICITABINE-TENOFOVIR DF 200-300 MG PO TABS: 1.0000 | ORAL_TABLET | Freq: Every day | ORAL | Status: DC

## 2015-05-01 MED ORDER — EFAVIRENZ 600 MG PO TABS: 600.0000 mg | ORAL_TABLET | Freq: Every day | ORAL | Status: DC

## 2015-05-01 MED ORDER — BUPROPION HCL ER (XL) 300 MG PO TB24: 300.0000 mg | ORAL_TABLET | Freq: Every day | ORAL | Status: DC

## 2015-05-01 MED ORDER — FLUOXETINE HCL 20 MG PO CAPS: 20.0000 mg | ORAL_CAPSULE | Freq: Every day | ORAL | Status: DC

## 2015-05-02 LAB — LIPID PANEL
CHOL/HDL RATIO: 3.7 ratio (ref <=5.0)
CHOLESTEROL: 198 mg/dL (ref 125–200)
HDL: 53 mg/dL (ref >=40)
LDL CALC: 125 mg/dL (ref <130)
Triglycerides: 102 mg/dL (ref <150)
VLDL: 20 mg/dL (ref <30)

## 2015-05-02 LAB — COMPREHENSIVE METABOLIC PANEL
ALT: 29 U/L (ref 9–46)
AST: 24 U/L (ref 10–40)
Albumin: 4.3 g/dL (ref 3.6–5.1)
Alkaline Phosphatase: 60 U/L (ref 40–115)
BUN: 12 mg/dL (ref 7–25)
CALCIUM: 9.6 mg/dL (ref 8.6–10.3)
CO2: 31 mmol/L (ref 20–31)
Chloride: 101 mmol/L (ref 98–110)
Creat: 1.08 mg/dL (ref 0.60–1.35)
GLUCOSE: 97 mg/dL (ref 65–99)
POTASSIUM: 4.4 mmol/L (ref 3.5–5.3)
Sodium: 139 mmol/L (ref 135–146)
Total Bilirubin: 0.4 mg/dL (ref 0.2–1.2)
Total Protein: 6.8 g/dL (ref 6.1–8.1)

## 2015-05-02 LAB — T-HELPER CELLS (CD4) COUNT (NOT AT ARMC)
Absolute CD4: 1166 cells/uL (ref 381–1469)
CD4 T Helper %: 44 % (ref 32–62)
TOTAL LYMPHOCYTE COUNT: 2671 {cells}/uL (ref 700–3300)

## 2015-05-02 NOTE — Progress Notes (Signed)
   Subjective:    Patient ID: Zara ChessMichael D Noori, male    DOB: 22-Mar-1971, 44 y.o.   MRN: [id number]  HPI  he is here for an interval evaluation. He continues on his HIV medicines and is having no difficulty with them. He does use Flonase for his allergies as well as Claritin in these are under good control. He is taking Wellbutrin as well as Prozac for his underlying dysthymia and seems comfortable with this. 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 Family and social history as well as immunizations and health maintenance was reviewed. His had no fever, chills, headaches. He has started an exercise program.   Review of Systems  All other systems reviewed and are negative.      Objective:   Physical Exam BP 114/82 mmHg  Pulse 72  Temp(Src) 99.4 F (37.4 C) (Oral)  Ht 5\' 11"  (1.803 m)  Wt 227 lb 3.2 oz (103.057 kg)  BMI 31.70 kg/m2  General Appearance:    Alert, cooperative, no distress, appears stated age  Head:    Normocephalic, without obvious abnormality, atraumatic  Eyes:    PERRL, conjunctiva/corneas clear, EOM's intact, fundi    benign  Ears:    Normal TM's and external ear canals  Nose:   Nares normal, mucosa normal, no drainage or sinus   tenderness  Throat:   Lips, mucosa, and tongue normal; teeth and gums normal  Neck:   Supple, no lymphadenopathy;  thyroid:  no   enlargement/tenderness/nodules; no carotid   bruit or JVD  Back:    Spine nontender, no curvature, ROM normal, no CVA     tenderness  Lungs:     Clear to auscultation bilaterally without wheezes, rales or     ronchi; respirations unlabored  Chest Wall:    No tenderness or deformity   Heart:    Regular rate and rhythm, S1 and S2 normal, no murmur, rub   or gallop  Breast Exam:    No chest wall tenderness, masses or gynecomastia  Abdomen:     Soft, non-tender, nondistended, normoactive bowel sounds,    no masses, no hepatosplenomegaly  Genitalia:  deferred  Rectal:   deferred    Extremities:   No clubbing, cyanosis or edema  Pulses:   2+ and symmetric all extremities  Skin:   Skin color, texture, turgor normal, no rashes or lesions  Lymph nodes:   Cervical, supraclavicular, and axillary nodes normal  Neurologic:   CNII-XII intact, normal strength, sensation and gait; reflexes 2+ and symmetric throughout          Psych:   Normal mood, affect, hygiene and grooming.          Assessment & Plan:  Allergic rhinitis, mild  Dysthymia - Plan: FLUoxetine (PROZAC) 20 MG capsule, buPROPion (WELLBUTRIN XL) 300 MG 24 hr tablet  Human immunodeficiency virus (HIV) disease (HCC) - Plan: emtricitabine-tenofovir (TRUVADA) 200-300 MG tablet, efavirenz (SUSTIVA) 600 MG tablet, CBC with Differential/Platelet, Comprehensive metabolic panel, HIV 1 RNA quant-no reflex-bld, T-helper cells (CD4) count ([facility]), Lipid panel, POCT urinalysis dipstick  Obesity (BMI 30-39.9) - Plan: CBC with Differential/Platelet, Comprehensive metabolic panel, Lipid panel  Need for prophylactic vaccination and inoculation against influenza - Plan: Flu Vaccine QUAD 36+ mos IM  encouraged him to continue with his exercise program and also make dietary adjustments.

## 2015-05-05 DIAGNOSIS — Z23 Encounter for immunization: Secondary | ICD-10-CM | POA: Diagnosis not present

## 2015-05-05 LAB — HIV-1 RNA QUANT-NO REFLEX-BLD
HIV 1 RNA Quant: <20 copies/mL (ref <20)
HIV-1 RNA Quant, Log: <1.3 Log copies/mL (ref <1.30)

## 2015-07-25 ENCOUNTER — Telehealth: Payer: Self-pay | Admitting: Family Medicine

## 2015-07-25 DIAGNOSIS — B20 Human immunodeficiency virus [HIV] disease: Secondary | ICD-10-CM

## 2015-07-25 MED ORDER — EFAVIRENZ 600 MG PO TABS: 600.0000 mg | ORAL_TABLET | Freq: Every day | ORAL | Status: DC

## 2015-07-25 MED ORDER — EMTRICITABINE-TENOFOVIR DF 200-300 MG PO TABS: 1.0000 | ORAL_TABLET | Freq: Every day | ORAL | Status: DC

## 2015-07-25 NOTE — Telephone Encounter (Signed)
Recv'd fax request from [facility] for Sustiva and Truvada

## 2015-08-14 ENCOUNTER — Telehealth: Payer: Self-pay | Admitting: Family Medicine

## 2015-08-14 DIAGNOSIS — B20 Human immunodeficiency virus [HIV] disease: Secondary | ICD-10-CM

## 2015-08-14 NOTE — Telephone Encounter (Signed)
Rcvd refill request to resent Sustiva 600mg  #90 & Truvada 200mg -300mg  #90 [facility] [address]

## 2015-08-15 NOTE — Telephone Encounter (Signed)
Is this ok to refill?  

## 2015-08-16 MED ORDER — EMTRICITABINE-TENOFOVIR DF 200-300 MG PO TABS: 1.0000 | ORAL_TABLET | Freq: Every day | ORAL | Status: DC

## 2015-08-16 MED ORDER — EFAVIRENZ 600 MG PO TABS: 600.0000 mg | ORAL_TABLET | Freq: Every day | ORAL | Status: DC

## 2016-04-19 ENCOUNTER — Encounter: Payer: Self-pay | Admitting: Family Medicine

## 2016-04-19 ENCOUNTER — Ambulatory Visit (INDEPENDENT_AMBULATORY_CARE_PROVIDER_SITE_OTHER): Payer: BC Managed Care – PPO | Admitting: Family Medicine

## 2016-04-19 VITALS — BP 116/78 | HR 88 | Ht 70.5 in | Wt 227.0 lb

## 2016-04-19 DIAGNOSIS — Z23 Encounter for immunization: Secondary | ICD-10-CM

## 2016-04-19 DIAGNOSIS — J309 Allergic rhinitis, unspecified: Secondary | ICD-10-CM | POA: Diagnosis not present

## 2016-04-19 DIAGNOSIS — Z79899 Other long term (current) drug therapy: Secondary | ICD-10-CM

## 2016-04-19 DIAGNOSIS — F341 Dysthymic disorder: Secondary | ICD-10-CM | POA: Diagnosis not present

## 2016-04-19 DIAGNOSIS — B20 Human immunodeficiency virus [HIV] disease: Secondary | ICD-10-CM | POA: Diagnosis not present

## 2016-04-19 DIAGNOSIS — E669 Obesity, unspecified: Secondary | ICD-10-CM

## 2016-04-19 LAB — LIPID PANEL
Cholesterol: 197 mg/dL (ref <200)
HDL: 57 mg/dL (ref >40)
LDL Cholesterol: 115 mg/dL — ABNORMAL HIGH (ref <100)
Total CHOL/HDL Ratio: 3.5 Ratio (ref <5.0)
Triglycerides: 127 mg/dL (ref <150)
VLDL: 25 mg/dL (ref <30)

## 2016-04-19 LAB — COMPREHENSIVE METABOLIC PANEL
ALK PHOS: 64 U/L (ref 40–115)
ALT: 29 U/L (ref 9–46)
AST: 23 U/L (ref 10–40)
Albumin: 4.6 g/dL (ref 3.6–5.1)
BILIRUBIN TOTAL: 0.3 mg/dL (ref 0.2–1.2)
BUN: 9 mg/dL (ref 7–25)
CALCIUM: 9.1 mg/dL (ref 8.6–10.3)
CO2: 25 mmol/L (ref 20–31)
Chloride: 106 mmol/L (ref 98–110)
Creat: 0.98 mg/dL (ref 0.60–1.35)
GLUCOSE: 95 mg/dL (ref 65–99)
Potassium: 4.3 mmol/L (ref 3.5–5.3)
Sodium: 139 mmol/L (ref 135–146)
Total Protein: 6.8 g/dL (ref 6.1–8.1)

## 2016-04-19 LAB — CBC WITH DIFFERENTIAL/PLATELET
BASOS PCT: 0 %
Basophils Absolute: 0 cells/uL (ref 0–200)
Eosinophils Absolute: 296 cells/uL (ref 15–500)
Eosinophils Relative: 4 %
HEMATOCRIT: 43.9 % (ref 38.5–50.0)
Hemoglobin: 15.5 g/dL (ref 13.2–17.1)
LYMPHS ABS: 2664 {cells}/uL (ref 850–3900)
LYMPHS PCT: 36 %
MCH: 30.8 pg (ref 27.0–33.0)
MCHC: 35.3 g/dL (ref 32.0–36.0)
MCV: 87.1 fL (ref 80.0–100.0)
MONO ABS: 740 {cells}/uL (ref 200–950)
MPV: 9.7 fL (ref 7.5–12.5)
Monocytes Relative: 10 %
NEUTROS ABS: 3700 {cells}/uL (ref 1500–7800)
Neutrophils Relative %: 50 %
PLATELETS: 225 10*3/uL (ref 140–400)
RBC: 5.04 MIL/uL (ref 4.20–5.80)
RDW: 13 % (ref 11.0–15.0)
WBC: 7.4 10*3/uL (ref 4.0–10.5)
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
## 2016-04-20 ENCOUNTER — Telehealth: Payer: Self-pay | Admitting: Family Medicine

## 2016-04-20 LAB — T-HELPER CELLS (CD4) COUNT (NOT AT ARMC)
ABSOLUTE CD4: 1194 {cells}/uL (ref 381–1469)
CD4 T HELPER %: 42 % (ref 32–62)
Total lymphocyte count: 2854 cells/uL (ref 700–3300)

## 2016-04-20 NOTE — Telephone Encounter (Signed)
Pt called and states that he has made appt with Kellie Moorjohn holt Thursday at 4pm and that the number is [phone] if you need to call to confirm with them, pt also states that you was going to call him some lexapro  After him making this appt, pt can be reached at [phone] with any questions

## 2016-04-21 LAB — HIV-1 RNA QUANT-NO REFLEX-BLD
HIV 1 RNA Quant: <20 copies/mL (ref <20)
HIV-1 RNA Quant, Log: <1.3 Log copies/mL (ref <1.30)

## 2016-04-21 MED ORDER — ESCITALOPRAM OXALATE 10 MG PO TABS: 10.0000 mg | ORAL_TABLET | Freq: Every day | ORAL | 1 refills | Status: DC

## 2016-04-21 NOTE — Telephone Encounter (Signed)
This encounter was created in error - please disregard.

## 2016-04-21 NOTE — Telephone Encounter (Signed)
Pt notified. Pt states he is not near a calendar right now but will callback to schedule f/u. Trixie Rude/RLB

## 2016-04-21 NOTE — Telephone Encounter (Signed)
LMTCB

## 2016-04-21 NOTE — Telephone Encounter (Signed)
Let him know that I called medication and  schedule to see me in about a month

## 2016-04-22 MED ORDER — EMTRICITABINE-TENOFOVIR DF 200-300 MG PO TABS: 1.0000 | ORAL_TABLET | Freq: Every day | ORAL | 3 refills | Status: DC

## 2016-04-22 MED ORDER — EFAVIRENZ 600 MG PO TABS: 600.0000 mg | ORAL_TABLET | Freq: Every day | ORAL | 3 refills | Status: DC

## 2016-04-22 NOTE — Addendum Note (Signed)
Addended by: Ronnald NianLALONDE, JOHN C on: 04/22/2016 01:11 PM   Modules accepted: Orders

## 2016-05-10 ENCOUNTER — Other Ambulatory Visit: Payer: Self-pay | Admitting: Family Medicine

## 2016-05-10 DIAGNOSIS — F341 Dysthymic disorder: Secondary | ICD-10-CM

## 2016-05-10 NOTE — Telephone Encounter (Signed)
Is this okay to refill? 

## 2016-05-21 ENCOUNTER — Ambulatory Visit: Payer: Self-pay | Admitting: Family Medicine

## 2016-06-17 ENCOUNTER — Ambulatory Visit (INDEPENDENT_AMBULATORY_CARE_PROVIDER_SITE_OTHER): Payer: BC Managed Care – PPO | Admitting: Family Medicine

## 2016-06-17 VITALS — BP 120/80 | HR 70 | Wt 229.0 lb

## 2016-06-17 DIAGNOSIS — F341 Dysthymic disorder: Secondary | ICD-10-CM

## 2016-06-17 MED ORDER — ESCITALOPRAM OXALATE 20 MG PO TABS: 20.0000 mg | ORAL_TABLET | Freq: Every day | ORAL | 1 refills | Status: DC

## 2016-06-17 NOTE — Progress Notes (Signed)
   Subjective:    Patient ID: Charles Ho, male    DOB: [birth year], 46 y.o.   MRN: [id number]  HPI He is here for recheck. There was confusion on medications that he was taken. He was actually taking Prozac and Lexapro at the same time. He was then given a note stating that he should stop the Wellbutrin. Review of my record indicates that was told him on his last encounter here. He is not sure if the medication is working. He has seen a therapist but apparently they've reached a point of coming back on an as-needed basis.    Review of Systems     Objective:   Physical Exam Alert and in no distress with appropriate affect.       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 He constantly referred to things that he is not able to do and restrictions that apparently are being placed on him. I pointed out the negativity of his thought processes. Then explained that if everything was that bad then it's time to look for another job or to reevaluate and spend more time on the positives. Encouraged him to get back into counseling to deal with his negative thought processes. I will increase his Lexapro to 20 mg. He is to call me in one month.

## 2016-06-17 NOTE — Patient Instructions (Signed)
I will increase the Lexapro 20 mg

## 2016-08-08 ENCOUNTER — Other Ambulatory Visit: Payer: Self-pay | Admitting: Family Medicine

## 2016-08-08 DIAGNOSIS — F341 Dysthymic disorder: Secondary | ICD-10-CM

## 2016-08-09 NOTE — Telephone Encounter (Signed)
Can he have a refill?

## 2016-08-16 ENCOUNTER — Other Ambulatory Visit: Payer: Self-pay | Admitting: Family Medicine

## 2016-08-16 NOTE — Telephone Encounter (Signed)
Is this okay to refill? 

## 2016-08-16 NOTE — Telephone Encounter (Signed)
Pt needs refill of Lexapro. He has called pharmacy and was told to call us. Trixie Rude/RLB

## 2016-08-24 ENCOUNTER — Telehealth: Payer: Self-pay | Admitting: Family Medicine

## 2016-08-24 DIAGNOSIS — F341 Dysthymic disorder: Secondary | ICD-10-CM

## 2016-08-24 MED ORDER — ESCITALOPRAM OXALATE 20 MG PO TABS: 20.0000 mg | ORAL_TABLET | Freq: Every day | ORAL | 2 refills | Status: DC

## 2016-08-24 NOTE — Telephone Encounter (Signed)
Pt called & said he has already picked up his Lexapro & it was switched to  and he doesn't know why,  He states the refill request was for  and that's what he's been on and for some reason it was switched to .   Please let pt know what happened, and fix

## 2016-08-24 NOTE — Telephone Encounter (Signed)
I'm not sure why he was given the 10 mg but I will call in the 20 g tablet.

## 2016-08-25 ENCOUNTER — Other Ambulatory Visit: Payer: Self-pay

## 2016-08-25 DIAGNOSIS — F341 Dysthymic disorder: Secondary | ICD-10-CM

## 2016-08-25 MED ORDER — ESCITALOPRAM OXALATE 20 MG PO TABS: 20.0000 mg | ORAL_TABLET | Freq: Every day | ORAL | 0 refills | Status: DC

## 2016-08-26 NOTE — Telephone Encounter (Signed)
Left message for pt was error in escitalopram  being sent in & that 90 days was sent to mail order and to take 2 of  til runs out of 10's.

## 2016-09-07 ENCOUNTER — Other Ambulatory Visit: Payer: Self-pay | Admitting: Family Medicine

## 2016-09-07 DIAGNOSIS — F341 Dysthymic disorder: Secondary | ICD-10-CM

## 2016-09-07 NOTE — Telephone Encounter (Signed)
Is this okay to refill? 

## 2016-09-22 ENCOUNTER — Other Ambulatory Visit: Payer: Self-pay | Admitting: Family Medicine

## 2016-09-22 DIAGNOSIS — B20 Human immunodeficiency virus [HIV] disease: Secondary | ICD-10-CM

## 2016-11-12 ENCOUNTER — Other Ambulatory Visit: Payer: Self-pay | Admitting: Family Medicine

## 2016-11-12 DIAGNOSIS — F341 Dysthymic disorder: Secondary | ICD-10-CM

## 2016-11-12 NOTE — Telephone Encounter (Signed)
Is this okay to refill? 

## 2017-02-18 ENCOUNTER — Other Ambulatory Visit: Payer: Self-pay | Admitting: Family Medicine

## 2017-02-18 DIAGNOSIS — F341 Dysthymic disorder: Secondary | ICD-10-CM

## 2017-02-18 NOTE — Telephone Encounter (Signed)
Is this okay to refill? 

## 2017-02-18 NOTE — Telephone Encounter (Signed)
He needs an appointment. Don't let them run out of his medication

## 2017-03-15 ENCOUNTER — Telehealth: Payer: Self-pay | Admitting: Family Medicine

## 2017-03-15 NOTE — Telephone Encounter (Signed)
Rcvd refill request for Escitalopram 20mg . Called pt and left message that per Dr Susann GivensLalonde he needs to make an appointment in order to get refills.

## 2017-03-28 ENCOUNTER — Ambulatory Visit: Payer: BC Managed Care – PPO | Admitting: Family Medicine

## 2017-03-28 ENCOUNTER — Encounter: Payer: Self-pay | Admitting: Family Medicine

## 2017-03-28 VITALS — BP 112/70 | HR 76 | Resp 16 | Wt 241.8 lb

## 2017-03-28 DIAGNOSIS — B20 Human immunodeficiency virus [HIV] disease: Secondary | ICD-10-CM

## 2017-03-28 DIAGNOSIS — E669 Obesity, unspecified: Secondary | ICD-10-CM

## 2017-03-28 DIAGNOSIS — F341 Dysthymic disorder: Secondary | ICD-10-CM | POA: Diagnosis not present

## 2017-03-28 DIAGNOSIS — J309 Allergic rhinitis, unspecified: Secondary | ICD-10-CM | POA: Diagnosis not present

## 2017-03-28 DIAGNOSIS — Z23 Encounter for immunization: Secondary | ICD-10-CM

## 2017-03-28 DIAGNOSIS — Z209 Contact with and (suspected) exposure to unspecified communicable disease: Secondary | ICD-10-CM

## 2017-03-28 LAB — LIPID PANEL
CHOL/HDL RATIO: 4 (calc) (ref <5.0)
Cholesterol: 230 mg/dL — ABNORMAL HIGH (ref <200)
HDL: 58 mg/dL (ref >40)
LDL Cholesterol (Calc): 146 mg/dL (calc) — ABNORMAL HIGH
NON-HDL CHOLESTEROL (CALC): 172 mg/dL — AB (ref <130)
TRIGLYCERIDES: 132 mg/dL (ref <150)

## 2017-03-28 LAB — COMPREHENSIVE METABOLIC PANEL
AG RATIO: 2.2 (calc) (ref 1.0–2.5)
ALT: 29 U/L (ref 9–46)
AST: 24 U/L (ref 10–40)
Albumin: 4.9 g/dL (ref 3.6–5.1)
Alkaline phosphatase (APISO): 68 U/L (ref 40–115)
BUN: 11 mg/dL (ref 7–25)
CHLORIDE: 104 mmol/L (ref 98–110)
CO2: 26 mmol/L (ref 20–32)
CREATININE: 1.03 mg/dL (ref 0.60–1.35)
Calcium: 9.7 mg/dL (ref 8.6–10.3)
GLOBULIN: 2.2 g/dL (ref 1.9–3.7)
GLUCOSE: 96 mg/dL (ref 65–99)
Potassium: 4.1 mmol/L (ref 3.5–5.3)
SODIUM: 138 mmol/L (ref 135–146)
TOTAL PROTEIN: 7.1 g/dL (ref 6.1–8.1)
Total Bilirubin: 0.4 mg/dL (ref 0.2–1.2)

## 2017-03-28 MED ORDER — BICTEGRAVIR-EMTRICITAB-TENOFOV 50-200-25 MG PO TABS: 1.0000 | ORAL_TABLET | Freq: Every day | ORAL | 3 refills | Status: DC

## 2017-03-28 MED ORDER — ESCITALOPRAM OXALATE 20 MG PO TABS: 20.0000 mg | ORAL_TABLET | Freq: Every day | ORAL | 3 refills | Status: DC
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
## 2017-03-30 LAB — HIV-1 RNA QUANT-NO REFLEX-BLD
HIV 1 RNA Quant: <20 copies/mL — AB
HIV-1 RNA QUANT, LOG: DETECTED {Log_copies}/mL — AB

## 2017-04-01 ENCOUNTER — Other Ambulatory Visit: Payer: Self-pay

## 2017-04-01 DIAGNOSIS — B20 Human immunodeficiency virus [HIV] disease: Secondary | ICD-10-CM

## 2017-04-01 LAB — CBC WITH DIFFERENTIAL/PLATELET
BASOS ABS: 58 {cells}/uL (ref 0–200)
Basophils Relative: 0.9 %
EOS PCT: 6.1 %
Eosinophils Absolute: 390 cells/uL (ref 15–500)
HCT: 41.1 % (ref 38.5–50.0)
HEMOGLOBIN: 14.5 g/dL (ref 13.2–17.1)
Lymphs Abs: 2400 cells/uL (ref 850–3900)
MCH: 30.3 pg (ref 27.0–33.0)
MCHC: 35.3 g/dL (ref 32.0–36.0)
MCV: 86 fL (ref 80.0–100.0)
MONOS PCT: 10.7 %
MPV: 10.5 fL (ref 7.5–12.5)
NEUTROS ABS: 2867 {cells}/uL (ref 1500–7800)
Neutrophils Relative %: 44.8 %
PLATELETS: 194 10*3/uL (ref 140–400)
RBC: 4.78 10*6/uL (ref 4.20–5.80)
RDW: 11.5 % (ref 11.0–15.0)
TOTAL LYMPHOCYTE: 37.5 %
WBC mixed population: 685 cells/uL (ref 200–950)
WBC: 6.4 10*3/uL (ref 3.8–10.8)

## 2017-12-23 ENCOUNTER — Ambulatory Visit
Admission: RE | Admit: 2017-12-23 | Discharge: 2017-12-23 | Disposition: A | Discharge status: Home / Self Care | Payer: BC Managed Care – PPO | Source: Ambulatory Visit | Attending: Family Medicine | Admitting: Family Medicine

## 2017-12-23 ENCOUNTER — Ambulatory Visit: Payer: BC Managed Care – PPO | Admitting: Family Medicine

## 2017-12-23 ENCOUNTER — Encounter: Payer: Self-pay | Admitting: Family Medicine

## 2017-12-23 VITALS — BP 130/80 | HR 72 | Temp 98.2°F | Wt 251.2 lb

## 2017-12-23 DIAGNOSIS — S99912A Unspecified injury of left ankle, initial encounter: Secondary | ICD-10-CM

## 2017-12-23 NOTE — Patient Instructions (Signed)
[facility].  Take 2 Aleve twice daily with food for the next week or two. Rest, ice and use the sleeve if this helps with your pain and swelling. Elevate your ankle when you are sitting.  We will call you with your x-ray results.  If you are not improving over the next couple of weeks then you should be seen again.

## 2017-12-23 NOTE — Progress Notes (Signed)
   Subjective:    Patient ID: Charles Ho, male    DOB: [birth year], 47 y.o.   MRN: [id number]  HPI Chief Complaint  Patient presents with  . right ankle injury    right ankle pain. fell last night in [employer] parking [place]. swelling and starting to turning blue. taking tylenol   He is here with complaints of acute left ankle pain since injuring it last night while walking in a parking lot. States he rolled his ankle and has had swelling and pain with ambulation since. Pain is medial and lateral. Does not radiate up his leg or into his foot.  States he injured the same ankle approximately one month ago and was no seen for it because it was healing.   He has tried Tylenol and elevating his ankle.   Denies fever, chills, other joint pain.   Reviewed allergies, medications, past medical, surgical, family, and social history.   Review of Systems Pertinent positives and negatives in the history of present illness.     Objective:   Physical Exam  Constitutional: He appears well-developed and well-nourished. No distress.  Musculoskeletal:       Left ankle: He exhibits decreased range of motion and swelling. He exhibits no deformity and normal pulse. Tenderness. Lateral malleolus tenderness found.       Left foot: Normal.   BP 130/80   Pulse 72   Temp 98.2 F (36.8 C) (Oral)   Wt 251 lb 3.2 oz (113.9 kg)   BMI 35.53 kg/m      Assessment & Plan:  Injury of left ankle, initial encounter - Plan: DG Ankle Complete Left  Plan to send him for an x-ray and treat conservatively.  He is ambulatory.  Left lower extremity is neurovascularly intact. He will try 2 Aleve twice daily.  Discussed RICE treatment.  Follow up pending XR or if symptoms are not improving over the next couple of weeks.

## 2018-03-17 ENCOUNTER — Telehealth: Payer: Self-pay | Admitting: Family Medicine

## 2018-03-17 ENCOUNTER — Other Ambulatory Visit: Payer: Self-pay | Admitting: Family Medicine

## 2018-03-17 DIAGNOSIS — B20 Human immunodeficiency virus [HIV] disease: Secondary | ICD-10-CM

## 2018-03-17 MED ORDER — BICTEGRAVIR-EMTRICITAB-TENOFOV 50-200-25 MG PO TABS: 1.0000 | ORAL_TABLET | Freq: Every day | ORAL | 0 refills | Status: DC

## 2018-03-17 NOTE — Telephone Encounter (Signed)
Recv'd [facility] pt needs refills Biktarvy 90 tabs

## 2018-03-17 NOTE — Telephone Encounter (Signed)
He needs an appointment

## 2018-03-17 NOTE — Telephone Encounter (Signed)
Left message for pt to schedule an appt.

## 2018-03-17 NOTE — Telephone Encounter (Signed)
Did someone take care of this?

## 2018-03-22 ENCOUNTER — Other Ambulatory Visit: Payer: Self-pay | Admitting: Family Medicine

## 2018-03-22 DIAGNOSIS — B20 Human immunodeficiency virus [HIV] disease: Secondary | ICD-10-CM

## 2018-03-23 ENCOUNTER — Other Ambulatory Visit: Payer: Self-pay | Admitting: Family Medicine

## 2018-03-23 DIAGNOSIS — F341 Dysthymic disorder: Secondary | ICD-10-CM

## 2018-03-23 NOTE — Telephone Encounter (Signed)
  LVM for pt to call back to advise a med check is needed. KH

## 2018-03-23 NOTE — Telephone Encounter (Signed)
[facility]. Please advise. KH

## 2018-03-23 NOTE — Telephone Encounter (Signed)
Make sure he is scheduled for an office visit

## 2018-06-08 ENCOUNTER — Ambulatory Visit: Payer: BC Managed Care – PPO | Admitting: Family Medicine

## 2018-06-08 ENCOUNTER — Encounter: Payer: Self-pay | Admitting: Family Medicine

## 2018-06-08 VITALS — BP 110/70 | HR 77 | Temp 98.4°F | Ht 70.0 in | Wt 249.2 lb

## 2018-06-08 DIAGNOSIS — N529 Male erectile dysfunction, unspecified: Secondary | ICD-10-CM

## 2018-06-08 DIAGNOSIS — B20 Human immunodeficiency virus [HIV] disease: Secondary | ICD-10-CM | POA: Diagnosis not present

## 2018-06-08 DIAGNOSIS — F341 Dysthymic disorder: Secondary | ICD-10-CM | POA: Diagnosis not present

## 2018-06-08 LAB — POCT URINALYSIS DIP (PROADVANTAGE DEVICE)
BILIRUBIN UA: NEGATIVE
Glucose, UA: NEGATIVE mg/dL
Ketones, POC UA: NEGATIVE mg/dL
Leukocytes, UA: NEGATIVE
NITRITE UA: NEGATIVE
PH UA: 6 (ref 5.0–8.0)
PROTEIN UA: NEGATIVE mg/dL
RBC UA: NEGATIVE
Specific Gravity, Urine: 1.03
Urobilinogen, Ur: 3.5

## 2018-06-08 MED ORDER — ESCITALOPRAM OXALATE 20 MG PO TABS: 20.0000 mg | ORAL_TABLET | Freq: Every day | ORAL | 0 refills | Status: DC

## 2018-06-08 MED ORDER — BICTEGRAVIR-EMTRICITAB-TENOFOV 50-200-25 MG PO TABS: 1.0000 | ORAL_TABLET | Freq: Every day | ORAL | 0 refills | Status: DC

## 2018-06-08 MED ORDER — TADALAFIL 20 MG PO TABS: 20.0000 mg | ORAL_TABLET | Freq: Every day | ORAL | 5 refills | Status: DC | PRN

## 2018-06-08 NOTE — Progress Notes (Signed)
   Subjective:    Patient ID: Charles Ho, male    DOB: [birth year], 48 y.o.   MRN: [id number]  HPI He is here for a follow-up visit.  He continues on his Biktarvy and having no difficulty with that.  He continues on Lexapro which does help keep him psychologically stable.  His allergies are causing no difficulty.  He would like some medication for erectile dysfunction.  He has had difficulty with that in the past but has not used it in quite some time.  He is now starting to socialize more.  He does have difficulty getting and maintaining an erection.  He does not smoke or drink.  He works as a Runner, broadcasting/film/video in [employer] and does like it although he does complain of on anxiety.   Review of Systems     Objective:   Physical Exam Alert and in no distress. Tympanic membranes and canals are normal. Pharyngeal area is normal. Neck is supple without adenopathy or thyromegaly. Cardiac exam shows a regular sinus rhythm without murmurs or gallops. Lungs are clear to auscultation.        Assessment & Plan:  Erectile dysfunction, unspecified erectile dysfunction type - Plan: tadalafil (CIALIS) 20 MG tablet  Human immunodeficiency virus (HIV) disease (HCC) - Plan: POCT Urinalysis DIP (Proadvantage Device), CBC with Differential/Platelet, Comprehensive metabolic panel, Lipid panel, T-helper cells (CD4) count (not at [facility]), HIV-1 RNA quant-no reflex-bld, bictegravir-emtricitabine-tenofovir AF (BIKTARVY) 50-200-25 MG TABS tablet  Dysthymia - Plan: escitalopram (LEXAPRO) 20 MG tablet Discussed the need for him to get involved in counseling if he continues have difficulty dealing with anxiety.  He tends to always given excuses to why he can or cannot get something done.  When I discussed the possibility of counseling he basically says he does not have the time and I explained that he would make time for things that are important.

## 2018-06-09 ENCOUNTER — Other Ambulatory Visit: Payer: Self-pay | Admitting: Family Medicine

## 2018-06-09 DIAGNOSIS — B20 Human immunodeficiency virus [HIV] disease: Secondary | ICD-10-CM

## 2018-06-09 NOTE — Telephone Encounter (Signed)
Is this okay to refill? Still pending lab results

## 2018-06-11 LAB — COMPREHENSIVE METABOLIC PANEL
A/G RATIO: 2.1 (ref 1.2–2.2)
ALK PHOS: 57 IU/L (ref 39–117)
ALT: 30 IU/L (ref 0–44)
AST: 23 IU/L (ref 0–40)
Albumin: 4.6 g/dL (ref 3.5–5.5)
BUN / CREAT RATIO: 8 — AB (ref 9–20)
BUN: 9 mg/dL (ref 6–24)
Bilirubin Total: 0.4 mg/dL (ref 0.0–1.2)
CHLORIDE: 104 mmol/L (ref 96–106)
CO2: 23 mmol/L (ref 20–29)
CREATININE: 1.11 mg/dL (ref 0.76–1.27)
Calcium: 9.6 mg/dL (ref 8.7–10.2)
GFR calc non Af Amer: 78 mL/min/{1.73_m2} (ref >59)
GFR, EST AFRICAN AMERICAN: 90 mL/min/{1.73_m2} (ref >59)
Globulin, Total: 2.2 g/dL (ref 1.5–4.5)
Glucose: 104 mg/dL — ABNORMAL HIGH (ref 65–99)
POTASSIUM: 4.1 mmol/L (ref 3.5–5.2)
Sodium: 139 mmol/L (ref 134–144)
Total Protein: 6.8 g/dL (ref 6.0–8.5)

## 2018-06-11 LAB — T-HELPER CELLS (CD4) COUNT (NOT AT ARMC)
% CD 4 POS. LYMPH.: 42.8 % (ref 30.8–58.5)
ABSOLUTE CD 4 HELPER: 1156 /uL (ref 359–1519)
BASOS ABS: 0 10*3/uL (ref 0.0–0.2)
BASOS: 0 %
EOS (ABSOLUTE): 0.2 10*3/uL (ref 0.0–0.4)
Eos: 3 %
Hematocrit: 42.4 % (ref 37.5–51.0)
Hemoglobin: 14.8 g/dL (ref 13.0–17.7)
IMMATURE GRANULOCYTES: 0 %
Immature Grans (Abs): 0 10*3/uL (ref 0.0–0.1)
LYMPHS: 40 %
Lymphocytes Absolute: 2.7 10*3/uL (ref 0.7–3.1)
MCH: 30.7 pg (ref 26.6–33.0)
MCHC: 34.9 g/dL (ref 31.5–35.7)
MCV: 88 fL (ref 79–97)
MONOS ABS: 0.6 10*3/uL (ref 0.1–0.9)
Monocytes: 9 %
NEUTROS PCT: 48 %
Neutrophils Absolute: 3.2 10*3/uL (ref 1.4–7.0)
PLATELETS: 223 10*3/uL (ref 150–450)
RBC: 4.82 x10E6/uL (ref 4.14–5.80)
RDW: 12.2 % (ref 11.6–15.4)
WBC: 6.7 10*3/uL (ref 3.4–10.8)

## 2018-06-11 LAB — LIPID PANEL
CHOLESTEROL TOTAL: 218 mg/dL — AB (ref 100–199)
Chol/HDL Ratio: 3.8 ratio (ref 0.0–5.0)
HDL: 57 mg/dL (ref >39)
LDL Calculated: 143 mg/dL — ABNORMAL HIGH (ref 0–99)
Triglycerides: 90 mg/dL (ref 0–149)
VLDL CHOLESTEROL CAL: 18 mg/dL (ref 5–40)

## 2018-06-11 LAB — HIV-1 RNA QUANT-NO REFLEX-BLD

## 2018-10-07 ENCOUNTER — Other Ambulatory Visit: Payer: Self-pay | Admitting: Family Medicine

## 2018-10-07 DIAGNOSIS — F341 Dysthymic disorder: Secondary | ICD-10-CM

## 2018-10-09 NOTE — Telephone Encounter (Signed)
[facility] Please advise KH 

## 2018-12-08 ENCOUNTER — Other Ambulatory Visit: Payer: Self-pay | Admitting: Family Medicine

## 2018-12-08 DIAGNOSIS — B20 Human immunodeficiency virus [HIV] disease: Secondary | ICD-10-CM

## 2018-12-08 NOTE — Telephone Encounter (Signed)
Left message for pt to call back to schedule med check

## 2018-12-08 NOTE — Telephone Encounter (Signed)
It is time for a med check appointment and blood work

## 2018-12-08 NOTE — Telephone Encounter (Signed)
Is this okay to refill? 

## 2018-12-21 ENCOUNTER — Other Ambulatory Visit: Payer: Self-pay

## 2018-12-21 DIAGNOSIS — Z20822 Contact with and (suspected) exposure to covid-19: Secondary | ICD-10-CM

## 2018-12-22 ENCOUNTER — Other Ambulatory Visit: Payer: Self-pay

## 2018-12-22 DIAGNOSIS — Z20822 Contact with and (suspected) exposure to covid-19: Secondary | ICD-10-CM

## 2018-12-24 LAB — NOVEL CORONAVIRUS, NAA: SARS-CoV-2, NAA: NOT DETECTED

## 2018-12-29 ENCOUNTER — Other Ambulatory Visit: Payer: Self-pay

## 2018-12-29 ENCOUNTER — Ambulatory Visit: Payer: BC Managed Care – PPO | Admitting: Family Medicine

## 2018-12-29 ENCOUNTER — Encounter: Payer: Self-pay | Admitting: Family Medicine

## 2018-12-29 VITALS — BP 120/82 | HR 66 | Temp 98.2°F | Ht 71.0 in | Wt 263.2 lb

## 2018-12-29 DIAGNOSIS — B20 Human immunodeficiency virus [HIV] disease: Secondary | ICD-10-CM

## 2018-12-29 DIAGNOSIS — E669 Obesity, unspecified: Secondary | ICD-10-CM | POA: Diagnosis not present

## 2018-12-29 DIAGNOSIS — F341 Dysthymic disorder: Secondary | ICD-10-CM | POA: Diagnosis not present

## 2018-12-29 DIAGNOSIS — N529 Male erectile dysfunction, unspecified: Secondary | ICD-10-CM

## 2018-12-29 DIAGNOSIS — J309 Allergic rhinitis, unspecified: Secondary | ICD-10-CM

## 2018-12-29 DIAGNOSIS — Z209 Contact with and (suspected) exposure to unspecified communicable disease: Secondary | ICD-10-CM

## 2018-12-29 DIAGNOSIS — Z0000 Encounter for general adult medical examination without abnormal findings: Secondary | ICD-10-CM

## 2018-12-29 DIAGNOSIS — Z23 Encounter for immunization: Secondary | ICD-10-CM | POA: Diagnosis not present

## 2018-12-29 DIAGNOSIS — Z1211 Encounter for screening for malignant neoplasm of colon: Secondary | ICD-10-CM

## 2018-12-29 MED ORDER — BIKTARVY 50-200-25 MG PO TABS: 1.0000 | ORAL_TABLET | Freq: Every day | ORAL | 3 refills | Status: DC

## 2018-12-29 MED ORDER — ESCITALOPRAM OXALATE 20 MG PO TABS: 20.0000 mg | ORAL_TABLET | Freq: Every day | ORAL | 3 refills | Status: DC

## 2018-12-29 NOTE — Progress Notes (Signed)
   Subjective:    Patient ID: Charles Ho, male    DOB: 1970/11/19, 48 y.o.   MRN: [id number]  HPI He is here for complete examination.  He continues on Hardy and is having no difficulty with that.  He would like some STD screening from previous potential STD exposure.  His weight has been going up over the last several years.  He continues to do nicely on his Lexapro.  His allergies seem to be under good control.  He has no other concerns or complaints.  Family and social history as well as health maintenance immunizations was reviewed. To start teaching fourth grade again.   Review of Systems  All other systems reviewed and are negative.      Objective:   Physical Exam Alert and in no distress. Tympanic membranes and canals are normal. Pharyngeal area is normal. Neck is supple without adenopathy or thyromegaly. Cardiac exam shows a regular sinus rhythm without murmurs or gallops. Lungs are clear to auscultation.  Abdominal exam shows no masses or tenderness.        Assessment & Plan:  Routine general medical examination at a health care facility - Plan: CBC with Differential/Platelet, Comprehensive metabolic panel, Lipid panel,  Human immunodeficiency virus (HIV) disease ([place]) - Plan: bictegravir-emtricitabine-tenofovir AF (BIKTARVY) 50-200-25 MG TABS tablet, HIV-1 RNA quant-no reflex-bld, T-helper cells (CD4) count (not at [facility]),  Obesity (BMI 30-39.9) - Plan: Discussed the need for him to make diet and exercise changes.  He states that he has started doing this within the last month or so.  Dysthymia - Plan: escitalopram (LEXAPRO) 20 MG tablet, continue  Allergic rhinitis, mild - Plan continue on present medications  Need for vaccination against Streptococcus pneumoniae - Plan: Pneumococcal polysaccharide vaccine 23-valent greater than or equal to 2yo subcutaneous/IM,  Contact with or exposure to communicable disease - Plan: GC/Chlamydia Probe Amp, RPR,   Erectile  dysfunction, unspecified erectile dysfunction type - Plan: Use Cialis as needed  Screening for colon cancer - Plan: Cologuard,

## 2018-12-30 LAB — T-HELPER CELLS (CD4) COUNT (NOT AT ARMC)
% CD 4 Pos. Lymph.: 39.9 % (ref 30.8–58.5)
Absolute CD 4 Helper: 918 /uL (ref 359–1519)
Basophils Absolute: 0.1 10*3/uL (ref 0.0–0.2)
Basos: 1 %
EOS (ABSOLUTE): 0.2 10*3/uL (ref 0.0–0.4)
Eos: 4 %
Hematocrit: 45.6 % (ref 37.5–51.0)
Hemoglobin: 15.5 g/dL (ref 13.0–17.7)
Immature Grans (Abs): 0 10*3/uL (ref 0.0–0.1)
Immature Granulocytes: 0 %
Lymphocytes Absolute: 2.3 10*3/uL (ref 0.7–3.1)
Lymphs: 38 %
MCH: 30.1 pg (ref 26.6–33.0)
MCHC: 34 g/dL (ref 31.5–35.7)
MCV: 89 fL (ref 79–97)
Monocytes Absolute: 0.7 10*3/uL (ref 0.1–0.9)
Monocytes: 11 %
Neutrophils Absolute: 2.8 10*3/uL (ref 1.4–7.0)
Neutrophils: 46 %
Platelets: 210 10*3/uL (ref 150–450)
RBC: 5.15 x10E6/uL (ref 4.14–5.80)
RDW: 12.1 % (ref 11.6–15.4)
WBC: 6.1 10*3/uL (ref 3.4–10.8)

## 2018-12-30 LAB — COMPREHENSIVE METABOLIC PANEL
ALT: 32 IU/L (ref 0–44)
AST: 26 IU/L (ref 0–40)
Albumin/Globulin Ratio: 2.6 — ABNORMAL HIGH (ref 1.2–2.2)
Albumin: 4.6 g/dL (ref 4.0–5.0)
Alkaline Phosphatase: 55 IU/L (ref 39–117)
BUN/Creatinine Ratio: 13 (ref 9–20)
BUN: 14 mg/dL (ref 6–24)
Bilirubin Total: 0.2 mg/dL (ref 0.0–1.2)
CO2: 22 mmol/L (ref 20–29)
Calcium: 9.1 mg/dL (ref 8.7–10.2)
Chloride: 104 mmol/L (ref 96–106)
Creatinine, Ser: 1.11 mg/dL (ref 0.76–1.27)
GFR calc Af Amer: 89 mL/min/{1.73_m2} (ref >59)
GFR calc non Af Amer: 77 mL/min/{1.73_m2} (ref >59)
Globulin, Total: 1.8 g/dL (ref 1.5–4.5)
Glucose: 117 mg/dL — ABNORMAL HIGH (ref 65–99)
Potassium: 4.4 mmol/L (ref 3.5–5.2)
Sodium: 139 mmol/L (ref 134–144)
Total Protein: 6.4 g/dL (ref 6.0–8.5)

## 2018-12-30 LAB — LIPID PANEL
Chol/HDL Ratio: 4.4 ratio (ref 0.0–5.0)
Cholesterol, Total: 207 mg/dL — ABNORMAL HIGH (ref 100–199)
HDL: 47 mg/dL (ref >39)
LDL Calculated: 146 mg/dL — ABNORMAL HIGH (ref 0–99)
Triglycerides: 68 mg/dL (ref 0–149)
VLDL Cholesterol Cal: 14 mg/dL (ref 5–40)

## 2018-12-30 LAB — HIV-1 RNA QUANT-NO REFLEX-BLD
HIV-1 RNA Viral Load Log: 1.477 log10copy/mL
HIV-1 RNA Viral Load: 30 copies/mL

## 2018-12-30 LAB — RPR: RPR Ser Ql: NONREACTIVE

## 2019-01-04 LAB — GC/CHLAMYDIA PROBE AMP
Chlamydia trachomatis, NAA: NEGATIVE
Neisseria Gonorrhoeae by PCR: NEGATIVE

## 2019-05-05 ENCOUNTER — Emergency Department (HOSPITAL_COMMUNITY)
Admission: EM | Admit: 2019-05-05 | Discharge: 2019-05-05 | Disposition: A | Discharge status: Home / Self Care | Payer: BC Managed Care – PPO | Attending: Emergency Medicine | Admitting: Emergency Medicine

## 2019-05-05 ENCOUNTER — Encounter (HOSPITAL_COMMUNITY): Payer: Self-pay | Admitting: Student

## 2019-05-05 ENCOUNTER — Other Ambulatory Visit: Payer: Self-pay

## 2019-05-05 ENCOUNTER — Emergency Department (HOSPITAL_COMMUNITY): Payer: BC Managed Care – PPO

## 2019-05-05 DIAGNOSIS — W19XXXA Unspecified fall, initial encounter: Secondary | ICD-10-CM

## 2019-05-05 DIAGNOSIS — Y999 Unspecified external cause status: Secondary | ICD-10-CM | POA: Diagnosis not present

## 2019-05-05 DIAGNOSIS — Z21 Asymptomatic human immunodeficiency virus [HIV] infection status: Secondary | ICD-10-CM | POA: Insufficient documentation

## 2019-05-05 DIAGNOSIS — M25522 Pain in left elbow: Secondary | ICD-10-CM | POA: Insufficient documentation

## 2019-05-05 DIAGNOSIS — S99922A Unspecified injury of left foot, initial encounter: Secondary | ICD-10-CM | POA: Diagnosis present

## 2019-05-05 DIAGNOSIS — Y92480 Sidewalk as the place of occurrence of the external cause: Secondary | ICD-10-CM | POA: Diagnosis not present

## 2019-05-05 DIAGNOSIS — Z79899 Other long term (current) drug therapy: Secondary | ICD-10-CM | POA: Diagnosis not present

## 2019-05-05 DIAGNOSIS — Y9301 Activity, walking, marching and hiking: Secondary | ICD-10-CM | POA: Insufficient documentation

## 2019-05-05 DIAGNOSIS — S92352A Displaced fracture of fifth metatarsal bone, left foot, initial encounter for closed fracture: Secondary | ICD-10-CM

## 2019-05-05 DIAGNOSIS — W010XXA Fall on same level from slipping, tripping and stumbling without subsequent striking against object, initial encounter: Secondary | ICD-10-CM | POA: Insufficient documentation

## 2019-05-05 MED ORDER — OXYCODONE-ACETAMINOPHEN 5-325 MG PO TABS: 1.0000 | ORAL_TABLET | Freq: Four times a day (QID) | ORAL | 0 refills | Status: DC | PRN

## 2019-05-05 NOTE — ED Notes (Signed)
Ortho tech at bedside 

## 2019-05-05 NOTE — ED Provider Notes (Signed)
[facility]-EMERGENCY DEPT Provider Note   CSN: [id number] Arrival date & time: 05/05/19  1245     History Chief Complaint  Patient presents with  . Fall  . Foot Pain  . Arm Pain    Charles Ho is a 48 y.o. male with a history of HIV, obesity, and dyslipidemia who presents to the emergency department status post mechanical fall last night with complaints of left foot and left elbow pain.  Patient states that he was walking on the sidewalk when he tripped and inverted the left foot falling to the ground.  Patient denies head injury or loss of consciousness.  He was able to get up on his own has been ambulatory since.  He states he is having pain to the outside of the left foot as well as to the left elbow/forearm.  Pain is constant, worse with movement, no alleviating factors.  Denies blood thinner use.  Denies headache, neck pain, back pain, numbness, weakness, or significant open wounds.  HPI     Past Medical History:  Diagnosis Date  . Allergy    RHINITIS  . Dyslipidemia   . HIV infection (HCC)   . Hypogonadism, male     Patient Active Problem List   Diagnosis Date Noted  . Obesity (BMI 30-39.9) 03/27/2012  . Dysthymia 03/27/2012  . Allergic rhinitis, mild 03/27/2012  . Human immunodeficiency virus (HIV) disease (HCC) 03/30/2011    History reviewed. No pertinent surgical history.     Family History  Problem Relation Age of Onset  . Hypertension Father   . Heart disease Father   . Diabetes Maternal Grandmother   . Cancer Maternal Grandfather     Social History   Tobacco Use  . Smoking status: Never Smoker  . Smokeless tobacco: Never Used  Substance Use Topics  . Alcohol use: No  . Drug use: No    Home Medications Prior to Admission medications   Medication Sig Start Date End Date Taking? Authorizing Provider  Ascorbic Acid (VITAMIN C) 1000 MG tablet Take 500 mg by mouth daily.     [provider]  b complex vitamins  tablet Take 1 tablet by mouth daily.    [provider]  bictegravir-emtricitabine-tenofovir AF (BIKTARVY) 50-200-25 MG TABS tablet Take 1 tablet by mouth daily. 12/29/18   Ronnald Nian, MD  cholecalciferol (VITAMIN D) 1000 UNITS tablet Take 1,000 Units by mouth daily.     [provider]  escitalopram (LEXAPRO) 20 MG tablet Take 1 tablet (20 mg total) by mouth daily. 12/29/18   Ronnald Nian, MD  fluticasone Aleda Grana) 50 MCG/ACT nasal spray INSTILL 2 SPRAYS INTO EACH NOSTRIL ONCE DAILY 05/10/16   Ronnald Nian, MD  Loratadine 10 MG CAPS Take 1 capsule by mouth daily.    [provider]  Probiotic Product (PROBIOTIC & ACIDOPHILUS EX ST) CAPS Take by mouth.      [provider]  tadalafil (CIALIS) 20 MG tablet Take 1 tablet (20 mg total) by mouth daily as needed for erectile dysfunction. 06/08/18   Ronnald Nian, MD    Allergies    Patient has no known allergies.  Review of Systems   Review of Systems  Constitutional: Negative for chills and fever.  Eyes: Negative for visual disturbance.  Respiratory: Negative for shortness of breath.   Cardiovascular: Negative for chest pain.  Gastrointestinal: Negative for abdominal pain.  Musculoskeletal: Positive for arthralgias. Negative for back pain and neck pain.  Skin:  Negative for wound.  Neurological: Negative for weakness, numbness and headaches.    Physical Exam Updated Vital Signs BP (!) 133/92 (BP Location: Right Arm)   Pulse 82   Temp 98.4 F (36.9 C) (Oral)   Resp 17   SpO2 98%   Physical Exam Vitals and nursing note reviewed.  Constitutional:      General: He is not in acute distress.    Appearance: Normal appearance. He is not ill-appearing or toxic-appearing.  HENT:     Head: Normocephalic and atraumatic.     Comments: No raccoon eyes or battle sign. Eyes:     Extraocular Movements: Extraocular movements intact.     Pupils: Pupils are equal, round, and reactive to light.  Neck:      Comments: No midline tenderness.  Cardiovascular:     Rate and Rhythm: Normal rate and regular rhythm.     Pulses:          Radial pulses are 2+ on the right side and 2+ on the left side.       Dorsalis pedis pulses are 2+ on the right side and 2+ on the left side.       Posterior tibial pulses are 2+ on the right side and 2+ on the left side.  Pulmonary:     Effort: Pulmonary effort is normal. No respiratory distress.     Breath sounds: Normal breath sounds.  Chest:     Chest wall: No tenderness.  Abdominal:     General: There is no distension.     Palpations: Abdomen is soft.     Tenderness: There is no abdominal tenderness.  Musculoskeletal:     Cervical back: Normal range of motion and neck supple.     Comments: No obvious deformity, appreciable swelling, edema, erythema, ecchymosis, warmth, or open wounds.  Upper extremities: Patient has intact throughout the right upper extremity and to the left shoulder, wrist, and all digits.  Left elbow flexion is limited to about 90 degrees, he is able to fully extend the left elbow.  Patient is tender to palpation over the radial head area/proximal third of the forearm as well as somewhat to the radial aspect of the elbow,but not over the medial/lateral epicondyle or olecranon. Back: No midline tenderness or palpable step-off. Lower extremities: Patient has intact AROM to bilateral hips, knees, ankles, and all digits. Tender to palpation to the fifth metatarsal.  Lower extremities are otherwise nontender.  Specifically no malleoli or tenderness or tenderness to the fibular head.    Skin:    General: Skin is warm and dry.     Capillary Refill: Capillary refill takes less than 2 seconds.  Neurological:     Mental Status: He is alert.     Comments: Alert. Clear speech. Sensation grossly intact to bilateral upper & lower extremities. 5/5 strength with plantar/dorsiflexion bilaterally & with grip strength bilaterally. Patient ambulatory     Psychiatric:        Mood and Affect: Mood normal.        Behavior: Behavior normal.     ED Results / Procedures / Treatments   Labs (all labs ordered are listed, but only abnormal results are displayed) Labs Reviewed - No data to display  EKG None  Radiology DG Elbow Complete Left  Result Date: 05/05/2019 CLINICAL DATA:  Left elbow pain after fall last night. EXAM: LEFT ELBOW - COMPLETE 3+ VIEW COMPARISON:  None. FINDINGS: No definite evidence of fracture or dislocation. Abnormal anterior and  posterior fat pad displacement is noted suggesting underlying joint effusion. There is no evidence of arthropathy or other focal bone abnormality. Soft tissues are unremarkable. IMPRESSION: Abnormal anterior and posterior fat pad displacement is noted consistent with underlying joint effusion. Occult fracture cannot be excluded. Electronically Signed   By: Lupita RaiderJames  Green Jr M.D.   On: 05/05/2019 10:24   DG Forearm Left  Result Date: 05/05/2019 CLINICAL DATA:  Left arm pain after fall last night. EXAM: LEFT FOREARM - 2 VIEW COMPARISON:  None. FINDINGS: There is no evidence of fracture or other focal bone lesions. Soft tissues are unremarkable. IMPRESSION: Negative. Electronically Signed   By: Lupita RaiderJames  Green Jr M.D.   On: 05/05/2019 10:22   DG Foot Complete Left  Result Date: 05/05/2019 CLINICAL DATA:  Pt reports that was walking around neighborhood last night when twisted left foot and fell. Pt c/o left foot pains and left arm pain from wrist to upper arm. fall EXAM: LEFT FOOT - COMPLETE 3+ VIEW COMPARISON:  None. FINDINGS: Horizontal fracture through the base of the fifth metatarsal. Mild distraction of the proximal fracture fragment. No dislocation. No additional fracture evident. IMPRESSION: Fracture through the base of the fifth metatarsal with mild distraction. Electronically Signed   By: Genevive BiStewart  Edmunds M.D.   On: 05/05/2019 10:27    Procedures Procedures (including critical care  time)  SPLINT APPLICATION Date/Time: 11:15 AM Authorized by: Harvie HeckSamantha Romana Deaton Consent: Verbal consent obtained. Risks and benefits: risks, benefits and alternatives were discussed Consent given by: patient Splint applied by: orthopedic technician Location details: LLE Splint type: CAM walker Supplies used: Cam walker Post-procedure: The splinted body part was neurovascularly unchanged following the procedure. Patient tolerance: Patient tolerated the procedure well with no immediate complications.  SPLINT APPLICATION Date/Time: 11:15 AM Authorized by: Harvie HeckSamantha Anaja Monts Consent: Verbal consent obtained. Risks and benefits: risks, benefits and alternatives were discussed Consent given by: patient Splint applied by: orthopedic technician Location details: LUE Splint type: sling Supplies used: sling Post-procedure: The splinted body part was neurovascularly unchanged following the procedure. Patient tolerance: Patient tolerated the procedure well with no immediate complications.  Medications Ordered in ED Medications - No data to display  ED Course/MDM  I have reviewed the triage vital signs and the nursing notes.  Pertinent labs & imaging results that were available during my care of the patient were reviewed by me and considered in my medical decision making (see chart for details).   Patient presents to the ED s/p mechanical fall with complaints of L foot/elbow/forearm pain. Patient is nontoxic appearing, in no apparent distress, vitals WNL with the exception of mildly elevated diastolic BP- doubt HTN emergency. No signs of serious head/neck/back/intrathoracic/abdominal injury- No abdominal/chest/midline spinal tenderness or focal neuro deficits. MSK xrays ordered in areas of tenderness.   Imaging notable for fracture through the base of the 5th metatarsal & findings concerning for occult fracture on left elbow xray. NVI distally to each of these areas. Will discuss with  orthopedics on call.   10:51: CONSULT: Discussed with orthopedic surgeon Dr. Ophelia CharterYates- recommends CAM walker for LLE & sling without splint for LUE. Weight bear as tolerated on LLE.   CAM & sling applied by ortho tech. Remains NVI distally.  Will discharge home with instructions for PRICE & motrin with percocet prescription for severe pain. I discussed results, treatment plan, need for follow-up, and return precautions with the patient. Provided opportunity for questions, patient confirmed understanding and is in agreement with plan.   Findings and plan of  care discussed with supervising physician Dr. Criss Alvine who is in agreement.   Final Clinical Impression(s) / ED Diagnoses Final diagnoses:  Closed displaced fracture of fifth metatarsal bone of left foot, initial encounter  Fall, initial encounter    Rx / DC Orders ED Discharge Orders         Ordered    oxyCODONE-acetaminophen (PERCOCET/ROXICET) 5-325 MG tablet  Every 6 hours PRN     05/05/19 [address] [place] R, PA-C 05/05/19 1120    Pricilla Loveless, MD 05/05/19 1212

## 2019-05-05 NOTE — ED Triage Notes (Signed)
Pt reports that was walking around neighborhood last night when twisted left foot and fell. Pt c/o left foot pains and left arm pain from wrist to upper arm.

## 2019-05-05 NOTE — Discharge Instructions (Addendum)
Please read and follow all provided instructions.  You have been seen today after an injury to your left elbow and foot.  Your xray show that you have a fracture of your 5th metatarsal- (bone in your foot) & findings concerning for occult fracture in your elbow.  We have placed you in a cam walker for your foot fracture & a sling for your elbow abnormalities please keep these clean & dry and intact until you have followed up with orthopedics. Please try to rest but you can put weight on the left foot as tolerated.  Please call orthopedic surgery tomorrow to schedule an appointment within the next 3-5 days.   Home care instructions: -- *PRICE in the first 24-48 hours after injury: Protect with splint Rest Ice- Do not apply ice pack directly to your splint place towel or similar between your splint and ice/ice pack. Apply ice for 20 min, then remove for 40 min while awake Compression- splint Elevate affected extremity above the level of your heart when not walking around for the first 24-48 hours   Medications:  Please take ibuprofen per over the counter dosing to help with pain/swelling.  If your pain is not alleviated by ibuprofen please take percocet.  -Percocet-this is a narcotic/controlled substance medication that has potential addicting qualities.  We recommend that you take 1-2 tablets every 6 hours as needed for severe pain.  Do not drive or operate heavy machinery when taking this medicine as it can be sedating. Do not drink alcohol or take other sedating medications when taking this medicine for safety reasons.  Keep this out of reach of small children.  Please be aware this medicine has Tylenol in it (325 mg/tab) do not exceed the maximum dose of Tylenol in a day per over the counter recommendations should you decide to supplement with Tylenol over the counter.   We have prescribed you new medication(s) today. Discuss the medications prescribed today with your pharmacist as they can  have adverse effects and interactions with your other medicines including over the counter and prescribed medications. Seek medical evaluation if you start to experience new or abnormal symptoms after taking one of these medicines, seek care immediately if you start to experience difficulty breathing, feeling of your throat closing, facial swelling, or rash as these could be indications of a more serious allergic reaction   Follow-up instructions: Please follow-up with the orthopedic surgeon in your discharge instructions 3-5  Return instructions:  Please return if your digits or extremity are numb or tingling, appear gray or blue, or you have severe pain (also elevate the extremity and loosen splint or wrap if you were given one) Please return if you have redness or fevers.  Please return to the Emergency Department if you experience worsening symptoms.  Please return if you have any other emergent concerns. Additional Information:  Your vital signs today were: BP (!) 133/92 (BP Location: Right Arm)   Pulse 82   Temp 98.4 F (36.9 C) (Oral)   Resp 17   SpO2 98%  If your blood pressure (BP) was elevated above 135/85 this visit, please have this repeated by your doctor within one month. ---------------

## 2019-05-09 ENCOUNTER — Telehealth: Payer: Self-pay

## 2019-05-10 ENCOUNTER — Telehealth: Payer: Self-pay | Admitting: Orthopaedic Surgery

## 2019-05-10 NOTE — Telephone Encounter (Signed)
Could you please call patient and add him to my schedule tomorrow afternoon? Dr. Lorin Mercy would like to see him sooner than next week.

## 2019-05-10 NOTE — Telephone Encounter (Signed)
Charles Ho,   This patient was sent here from ED and is having pain in here foot. She has not seen Dr. Lorin Mercy yet but calling for [facility] in for her until she can see him. She is taking less than prescribed so she wouldn't run out.  Please call patient at [phone]

## 2019-05-10 NOTE — Telephone Encounter (Signed)
Error

## 2019-05-11 ENCOUNTER — Encounter: Payer: Self-pay | Admitting: Orthopaedic Surgery

## 2019-05-11 ENCOUNTER — Ambulatory Visit: Payer: BC Managed Care – PPO | Admitting: Orthopaedic Surgery

## 2019-05-11 ENCOUNTER — Other Ambulatory Visit: Payer: Self-pay

## 2019-05-11 DIAGNOSIS — S92309A Fracture of unspecified metatarsal bone(s), unspecified foot, initial encounter for closed fracture: Secondary | ICD-10-CM | POA: Insufficient documentation

## 2019-05-11 DIAGNOSIS — S52122A Displaced fracture of head of left radius, initial encounter for closed fracture: Secondary | ICD-10-CM | POA: Insufficient documentation

## 2019-05-11 DIAGNOSIS — S52125A Nondisplaced fracture of head of left radius, initial encounter for closed fracture: Secondary | ICD-10-CM

## 2019-05-11 DIAGNOSIS — S92352A Displaced fracture of fifth metatarsal bone, left foot, initial encounter for closed fracture: Secondary | ICD-10-CM

## 2019-05-11 MED ORDER — OXYCODONE-ACETAMINOPHEN 5-325 MG PO TABS: 1.0000 | ORAL_TABLET | Freq: Four times a day (QID) | ORAL | 0 refills | Status: DC | PRN

## 2019-05-11 NOTE — Progress Notes (Signed)
Office Visit Note   Patient: Charles Ho           Date of Birth: March 15, 1971           MRN: [id number] Visit Date: 05/11/2019              Requested by: Denita Lung, MD [address] [place],  [place] [id number] PCP: Denita Lung, MD   Assessment & Plan: The daughter said she had some dementia but Visit Diagnoses:  1. Closed displaced fracture of fifth metatarsal bone of left foot, initial encounter   2. Closed nondisplaced fracture of head of left radius, initial encounter     Plan: Continue sling for nondisplaced radial head fracture.  Continue cam boot for left fifth metatarsal proximal fracture.  We discussed the peroneal avulsion injury.  Work slip given for him to work at home for the next week.  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  He like to get back with the other teachers are present.  Return 3 weeks.  Repeat x-rays left elbow.  No x-ray needed of left foot.  The lady with hip fracture she demented her little bit demented but she  Follow-Up Instructions: Return in about 3 weeks (around 06/01/2019).   Orders:  No orders of the defined types were placed in this encounter.  Meds ordered this encounter  Medications  . oxyCODONE-acetaminophen (PERCOCET/ROXICET) 5-325 MG tablet    Sig: Take 1 tablet by mouth every 6 (six) hours as needed for severe pain.    Dispense:  12 tablet    Refill:  0      Procedures: No procedures performed   Clinical Data: No additional findings.   Subjective: Chief Complaint  Patient presents with  . Left Foot - Pain, Fracture    Fall 05/04/2019  . Left Elbow - Pain    Fall 05/04/2019    HPI 48 year old male was out walking in the neighborhood.  He had his mask on not sure was looking down but tripped on the sidewalk inverting his left foot with sharp left foot pain also fell on his outstretched left arm with left elbow  pain.  Is seen in the emergency room where x-rays were obtained which demonstrated fat pad sign for the elbow and my review shows radial head fracture without displacement.  This involves the anterior cortex with slight impaction.  X-rays of his foot demonstrated fifth metatarsal avulsion injury.  "Dancers fracture". Review of Systems positive for HIV on medication excellent compliance.  Positive for obesity, fall as mentioned above allergic rhinitis otherwise negative as pertains HPI.   Objective: Vital Signs: BP (!) 143/90   Pulse 87   Ht 5\' 11"  (1.803 m)   Wt 265 lb (120.2 kg)   BMI 36.96 kg/m   Physical Exam Constitutional:      Appearance: Normal appearance. He is well-developed.     Comments: Patient pleasant conversant, cooperative, oriented.  HENT:     Head: Normocephalic and atraumatic.  Eyes:     Pupils: Pupils are equal, round, and reactive to light.  Neck:     Thyroid: No thyromegaly.     Trachea: No tracheal deviation.  Cardiovascular:     Rate and Rhythm: Normal rate.  Pulmonary:     Effort: Pulmonary effort is normal.     Breath sounds: No wheezing.  Abdominal:  General: Bowel sounds are normal.     Palpations: Abdomen is soft.  Skin:    General: Skin is warm and dry.     Capillary Refill: Capillary refill takes less than 2 seconds.  Neurological:     Mental Status: He is alert and oriented to person, place, and time.  Psychiatric:        Behavior: Behavior normal.        Thought Content: Thought content normal.        Judgment: Judgment normal.     Ortho Exam patient has some tenderness over the radial head with elbow effusion noted laterally.  Tenderness over the proximal fifth metatarsal.  Good range of motion cervical spine.  He is in a left sling.  He has been amatory in the cam boot.  Specialty Comments:  No specialty comments available.  Imaging: No results found.   PMFS History: Patient Active Problem List   Diagnosis Date Noted  .  Metatarsal bone fracture 05/11/2019  . Left radial head fracture 05/11/2019  . Obesity (BMI 30-39.9) 03/27/2012  . Dysthymia 03/27/2012  . Allergic rhinitis, mild 03/27/2012  . Human immunodeficiency virus (HIV) disease (HCC) 03/30/2011   Past Medical History:  Diagnosis Date  . Allergy    RHINITIS  . Dyslipidemia   . HIV infection (HCC)   . Hypogonadism, male     Family History  Problem Relation Age of Onset  . Hypertension Father   . Heart disease Father   . Diabetes Maternal Grandmother   . Cancer Maternal Grandfather     No past surgical history on file. Social History   Occupational History  . Not on file  Tobacco Use  . Smoking status: Never Smoker  . Smokeless tobacco: Never Used  Substance and Sexual Activity  . Alcohol use: No  . Drug use: No  . Sexual activity: Not Currently

## 2019-05-15 ENCOUNTER — Ambulatory Visit: Payer: BC Managed Care – PPO | Admitting: Orthopaedic Surgery

## 2019-06-01 ENCOUNTER — Ambulatory Visit: Payer: BC Managed Care – PPO | Admitting: Orthopaedic Surgery

## 2019-07-28 ENCOUNTER — Ambulatory Visit: Payer: BC Managed Care – PPO | Attending: Internal Medicine

## 2019-07-28 DIAGNOSIS — Z23 Encounter for immunization: Secondary | ICD-10-CM | POA: Insufficient documentation

## 2019-07-28 NOTE — Progress Notes (Signed)
   Covid-19 Vaccination Clinic  Name:  Charles Ho    MRN: [id number] DOB: 07/24/1970  07/28/2019  Mr. Charles Ho was observed post Covid-19 immunization for 15 minutes without incident. He was provided with Vaccine Information Sheet and instruction to access the V-Safe system.   Mr. Charles Ho was instructed to call 911 with any severe reactions post vaccine: Marland Kitchen Difficulty breathing  . Swelling of face and throat  . A fast heartbeat  . A bad rash all over body  . Dizziness and weakness   Immunizations Administered    Name Date Dose VIS Date Route   Pfizer COVID-19 Vaccine 07/28/2019 11:22 AM 0.3 mL 05/04/2019 Intramuscular   Manufacturer: [employer], [employer]   Lot: [id number]   NDC: [address]-2

## 2019-08-18 ENCOUNTER — Ambulatory Visit: Payer: BC Managed Care – PPO | Attending: Internal Medicine

## 2019-08-18 DIAGNOSIS — Z23 Encounter for immunization: Secondary | ICD-10-CM

## 2019-08-18 NOTE — Progress Notes (Signed)
   Covid-19 Vaccination Clinic  Name:  Charles Ho    MRN: [id number] DOB: [birth year]  08/18/2019  [name] was observed post Covid-19 immunization for 15 minutes without incident. He was provided with Vaccine Information Sheet and instruction to access the V-Safe system.   [name] was instructed to call 911 with any severe reactions post vaccine: Marland Kitchen Difficulty breathing  . Swelling of face and throat  . A fast heartbeat  . A bad rash all over body  . Dizziness and weakness   Immunizations Administered    Name Date Dose VIS Date Route   Pfizer COVID-19 Vaccine 08/18/2019 11:31 AM 0.3 mL 05/04/2019 Intramuscular   Manufacturer: [employer], [employer]   Lot: [id number]   NDC: [address]-9

## 2020-01-16 ENCOUNTER — Other Ambulatory Visit: Payer: Self-pay | Admitting: Family Medicine

## 2020-01-16 DIAGNOSIS — F341 Dysthymic disorder: Secondary | ICD-10-CM

## 2020-01-16 NOTE — Telephone Encounter (Signed)
[facility] Please advise KH 

## 2020-02-27 ENCOUNTER — Other Ambulatory Visit: Payer: Self-pay | Admitting: Family Medicine

## 2020-02-27 DIAGNOSIS — B20 Human immunodeficiency virus [HIV] disease: Secondary | ICD-10-CM

## 2020-02-27 NOTE — Telephone Encounter (Signed)
My chart message was sent to advise of the appt. KH

## 2020-02-27 NOTE — Telephone Encounter (Signed)
[facility] is requesting to fill pt biktarvy. Please advise [facility]

## 2020-02-27 NOTE — Telephone Encounter (Signed)
He needs an appointment

## 2020-02-27 NOTE — Telephone Encounter (Signed)
LVM for pt to call back and schedule an appt. KH

## 2020-02-28 NOTE — Telephone Encounter (Signed)
Mailed letter. 02/28/20 KH 

## 2020-02-29 NOTE — Telephone Encounter (Signed)
PT. Called per Kim's message to schedule a med check apt. I got him scheduled on 03/17/20 at 3:30 pm. And he wanted to know if it needed to be fasting if so he would have to reschedule to a morning apt.

## 2020-03-02 NOTE — Telephone Encounter (Signed)
Okay to keep that appointment time

## 2020-03-13 ENCOUNTER — Telehealth: Payer: Self-pay

## 2020-03-13 NOTE — Telephone Encounter (Signed)
Yes I definitely want blood work

## 2020-03-13 NOTE — Telephone Encounter (Signed)
Recv'd message from answering service, pt called at lunch, wants to know if 03/17/20 appt is a fasting appt? And states he will not come into the office if he's not getting blood work taken.  Ok to leave a message when you call back, he is a Runner, broadcasting/film/video.

## 2020-03-13 NOTE — Telephone Encounter (Signed)
Does pt need to be fasting?

## 2020-03-13 NOTE — Telephone Encounter (Signed)
Preferably fasting

## 2020-03-14 NOTE — Telephone Encounter (Signed)
Called pt & informed and he will fast for 6 hours.

## 2020-03-17 ENCOUNTER — Encounter: Payer: Self-pay | Admitting: Family Medicine

## 2020-03-17 ENCOUNTER — Other Ambulatory Visit: Payer: Self-pay

## 2020-03-17 ENCOUNTER — Ambulatory Visit: Payer: BC Managed Care – PPO | Admitting: Family Medicine

## 2020-03-17 VITALS — BP 132/88 | HR 78 | Temp 98.8°F | Wt 256.8 lb

## 2020-03-17 DIAGNOSIS — Z23 Encounter for immunization: Secondary | ICD-10-CM

## 2020-03-17 DIAGNOSIS — F341 Dysthymic disorder: Secondary | ICD-10-CM | POA: Diagnosis not present

## 2020-03-17 DIAGNOSIS — E669 Obesity, unspecified: Secondary | ICD-10-CM

## 2020-03-17 DIAGNOSIS — Z1211 Encounter for screening for malignant neoplasm of colon: Secondary | ICD-10-CM

## 2020-03-17 DIAGNOSIS — B20 Human immunodeficiency virus [HIV] disease: Secondary | ICD-10-CM

## 2020-03-17 DIAGNOSIS — J309 Allergic rhinitis, unspecified: Secondary | ICD-10-CM

## 2020-03-17 LAB — COMPREHENSIVE METABOLIC PANEL: BUN/Creatinine Ratio: 8 — ABNORMAL LOW (ref 9–20)

## 2020-03-17 MED ORDER — BIKTARVY 50-200-25 MG PO TABS: 1.0000 | ORAL_TABLET | Freq: Every day | ORAL | 0 refills | Status: DC

## 2020-03-17 NOTE — Progress Notes (Signed)
   Subjective:    Patient ID: Charles Ho, male    DOB: [birth year], 49 y.o.   MRN: [id number]  HPI He is here for medication management visit.  He continues on [place] and has had no difficulty with that medication and has been very consistent with it.  He continues on Lexapro which she states helps keep him quite calm.  He does intermittently use CBD oil.  He wants to stay on this medication.  He has not had any recent sexual activity therefore is not in need of any STD testing.  His weight has gone up to a Covid but he states that he is now in a weight loss regimen has lost several pounds over the last several months.  He does have underlying allergies and they are under good control.  He does not smoke or drink.  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  Health maintenance and immunizations as well as social and family history was reviewed  Review of Systems     Objective:   Physical Exam Alert and in no distress. Tympanic membranes and canals are normal. Pharyngeal area is normal. Neck is supple without adenopathy or thyromegaly. Cardiac exam shows a regular sinus rhythm without murmurs or gallops. Lungs are clear to auscultation.        Assessment & Plan:  Human immunodeficiency virus (HIV) disease (HCC) - Plan: CBC with Differential/Platelet, Comprehensive metabolic panel, Lipid panel, HIV-1 RNA quant-no reflex-bld, T-helper cells (CD4) count (not at [facility]), bictegravir-emtricitabine-tenofovir AF (BIKTARVY) 50-200-25 MG TABS tablet  Immunization, viral disease - Plan: Pfizer SARS-COV-2 Vaccine  Obesity (BMI 30-39.9) - Plan: CBC with Differential/Platelet, Comprehensive metabolic panel, Lipid panel  Dysthymia  Allergic rhinitis, mild  Need for influenza vaccination - Plan: Flu Vaccine QUAD 36+ mos IM  Screening for colon cancer - Plan: Cologuard His immunizations were updated.  I again encouraged him to continue with his weight loss regimen.  He will continue on his  medication regimen.

## 2020-03-18 LAB — COMPREHENSIVE METABOLIC PANEL
Alkaline Phosphatase: 64 IU/L (ref 44–121)
Glucose: 94 mg/dL (ref 65–99)
Total Protein: 7 g/dL (ref 6.0–8.5)

## 2020-03-19 LAB — COMPREHENSIVE METABOLIC PANEL
ALT: 28 IU/L (ref 0–44)
AST: 22 IU/L (ref 0–40)
Albumin/Globulin Ratio: 2 (ref 1.2–2.2)
Albumin: 4.7 g/dL (ref 3.8–4.9)
BUN: 8 mg/dL (ref 6–24)
Bilirubin Total: <0.2 mg/dL (ref 0.0–1.2)
CO2: 25 mmol/L (ref 20–29)
Calcium: 9.4 mg/dL (ref 8.7–10.2)
Chloride: 102 mmol/L (ref 96–106)
Creatinine, Ser: 1.05 mg/dL (ref 0.76–1.27)
GFR calc Af Amer: 95 mL/min/{1.73_m2} (ref >59)
GFR calc non Af Amer: 82 mL/min/{1.73_m2} (ref >59)
Globulin, Total: 2.3 g/dL (ref 1.5–4.5)
Potassium: 4.4 mmol/L (ref 3.5–5.2)
Sodium: 140 mmol/L (ref 134–144)

## 2020-03-19 LAB — T-HELPER CELLS (CD4) COUNT (NOT AT ARMC)
% CD 4 Pos. Lymph.: 43.4 % (ref 30.8–58.5)
Absolute CD 4 Helper: 1302 /uL (ref 359–1519)
Basophils Absolute: 0 10*3/uL (ref 0.0–0.2)
Basos: 1 %
EOS (ABSOLUTE): 0.3 10*3/uL (ref 0.0–0.4)
Eos: 4 %
Hematocrit: 44.4 % (ref 37.5–51.0)
Hemoglobin: 15.7 g/dL (ref 13.0–17.7)
Immature Grans (Abs): 0 10*3/uL (ref 0.0–0.1)
Immature Granulocytes: 0 %
Lymphocytes Absolute: 3 10*3/uL (ref 0.7–3.1)
Lymphs: 41 %
MCH: 30.7 pg (ref 26.6–33.0)
MCHC: 35.4 g/dL (ref 31.5–35.7)
MCV: 87 fL (ref 79–97)
Monocytes Absolute: 0.9 10*3/uL (ref 0.1–0.9)
Monocytes: 13 %
Neutrophils Absolute: 2.9 10*3/uL (ref 1.4–7.0)
Neutrophils: 41 %
Platelets: 187 10*3/uL (ref 150–450)
RBC: 5.12 x10E6/uL (ref 4.14–5.80)
RDW: 11.9 % (ref 11.6–15.4)
WBC: 7.2 10*3/uL (ref 3.4–10.8)

## 2020-03-19 LAB — LIPID PANEL
Chol/HDL Ratio: 3.2 ratio (ref 0.0–5.0)
Cholesterol, Total: 184 mg/dL (ref 100–199)
HDL: 58 mg/dL (ref >39)
LDL Chol Calc (NIH): 104 mg/dL — ABNORMAL HIGH (ref 0–99)
Triglycerides: 122 mg/dL (ref 0–149)
VLDL Cholesterol Cal: 22 mg/dL (ref 5–40)

## 2020-03-19 LAB — HIV-1 RNA QUANT-NO REFLEX-BLD
HIV-1 RNA Viral Load Log: 1.602 log10copy/mL
HIV-1 RNA Viral Load: 40 copies/mL

## 2020-05-20 LAB — COLOGUARD
COLOGUARD: NEGATIVE
Cologuard: NEGATIVE

## 2020-05-21 ENCOUNTER — Encounter: Payer: Self-pay | Admitting: Internal Medicine

## 2020-05-21 ENCOUNTER — Telehealth: Payer: Self-pay | Admitting: Internal Medicine

## 2020-05-21 NOTE — Telephone Encounter (Signed)
Left message on pt's vm that cologuard was negative

## 2020-06-09 ENCOUNTER — Other Ambulatory Visit: Payer: Self-pay | Admitting: Family Medicine

## 2020-06-09 DIAGNOSIS — B20 Human immunodeficiency virus [HIV] disease: Secondary | ICD-10-CM

## 2020-06-09 NOTE — Telephone Encounter (Signed)
[facility]. Please advise [facility]

## 2020-09-05 ENCOUNTER — Other Ambulatory Visit: Payer: Self-pay | Admitting: Family Medicine

## 2020-09-05 DIAGNOSIS — F341 Dysthymic disorder: Secondary | ICD-10-CM

## 2020-09-08 NOTE — Telephone Encounter (Signed)
[facility] I think something is wrong with the shipment. Please add QTY. KH

## 2020-10-19 IMAGING — CR DG FOOT COMPLETE 3+V*L*
3 series · 3 of 3 positions shown · non-contrast
Comparison: None.

CLINICAL DATA: Pt reports that was walking around neighborhood last
night when twisted left foot and fell. Pt c/o left foot pains and
left arm pain from wrist to upper arm. fall

EXAM:
LEFT FOOT - COMPLETE 3+ VIEW

[x foot ap left]
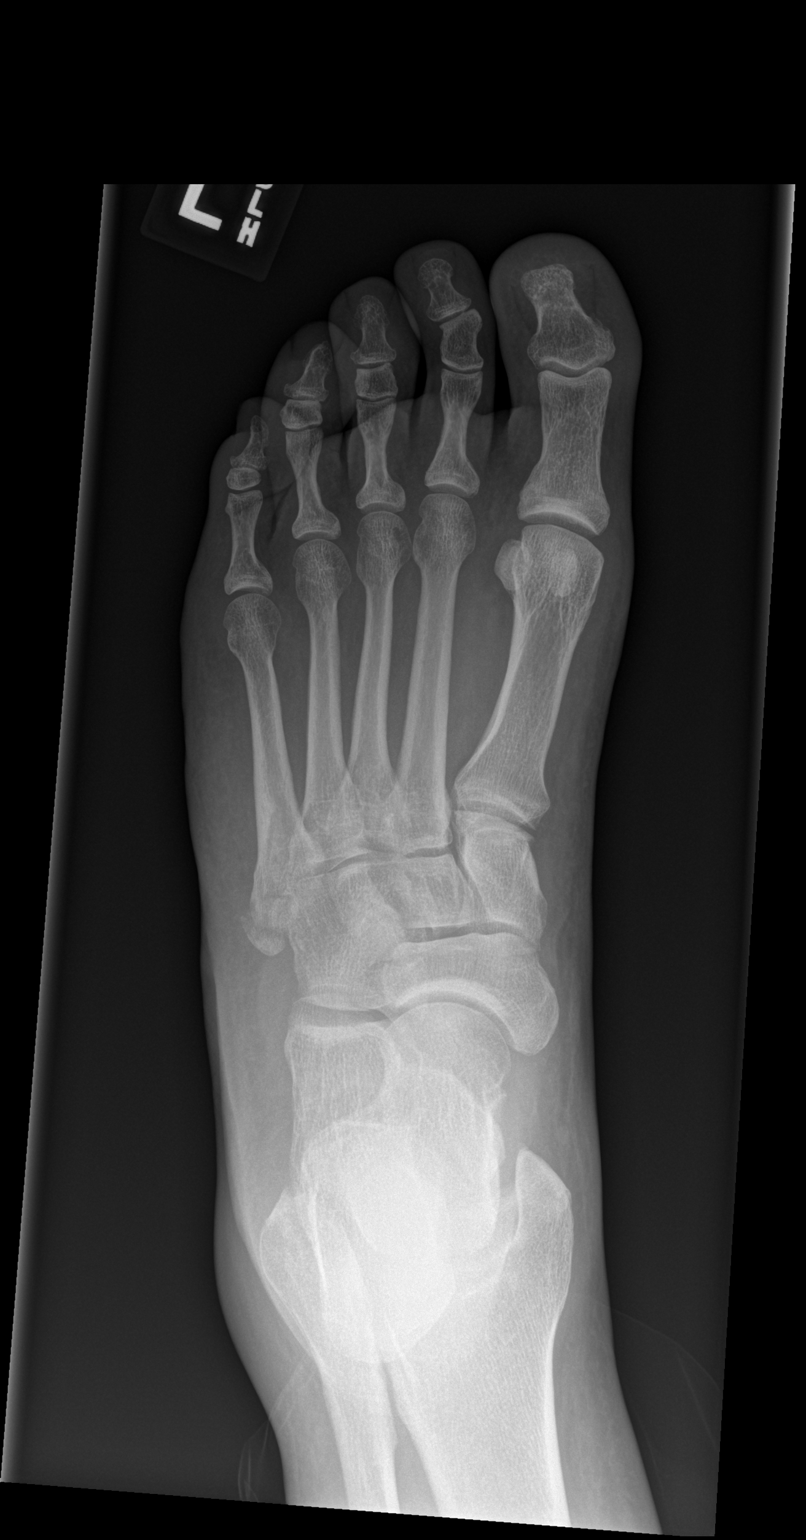

[x foot obl left]
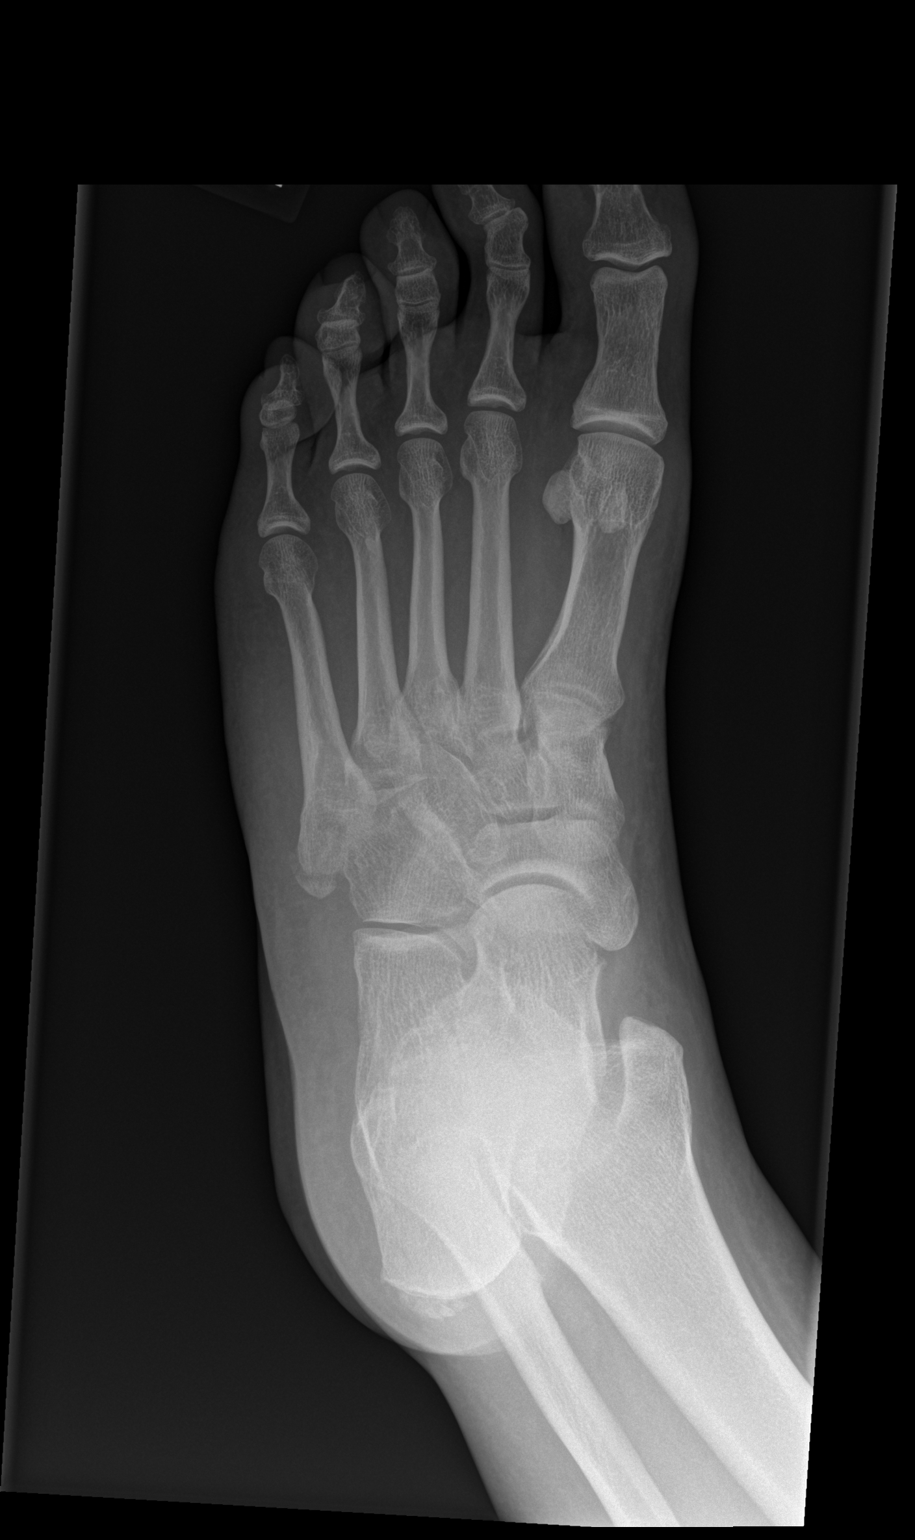

[x foot lat left]
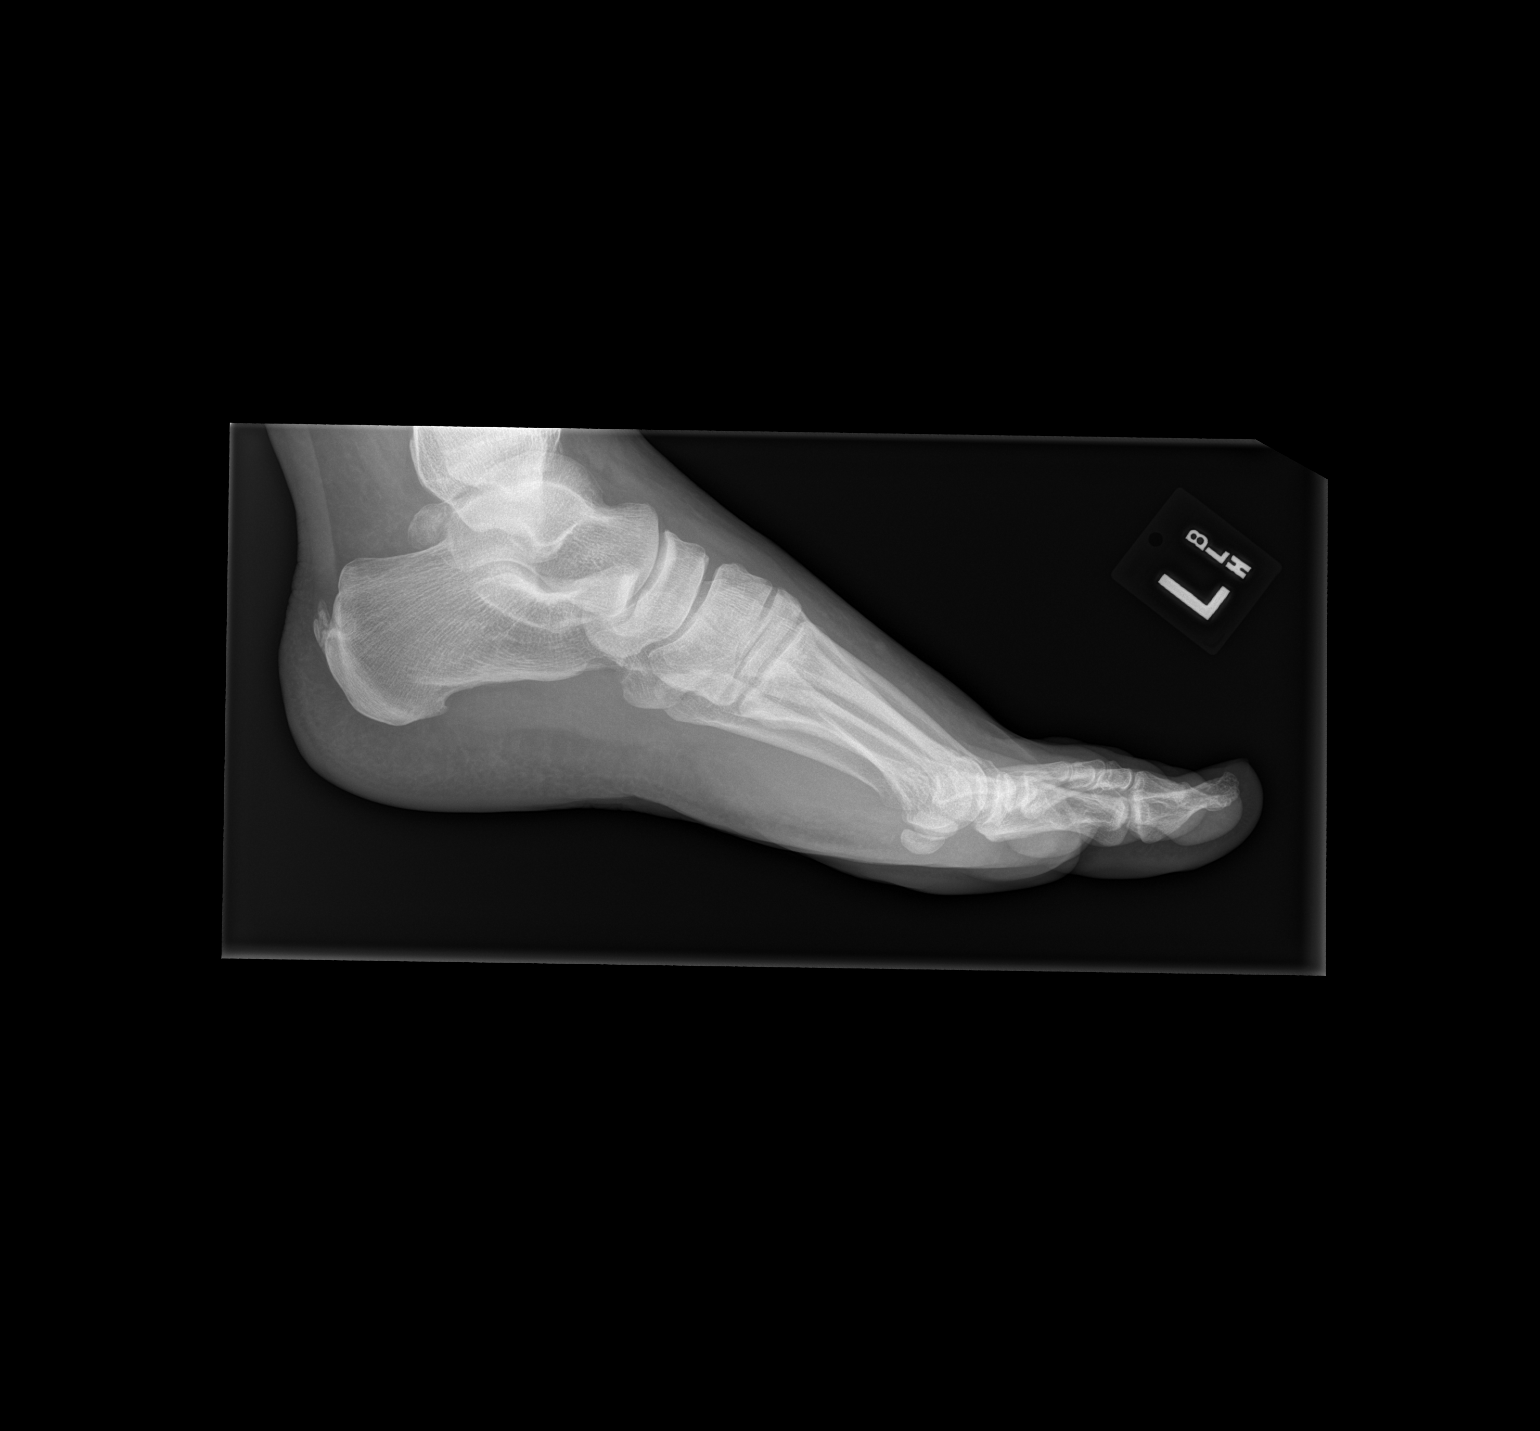

[3 of 3 positions shown; findings below may reference images not displayed]

FINDINGS: Horizontal fracture through the base of the fifth metatarsal. Mild
distraction of the proximal fracture fragment. No dislocation. No
additional fracture evident.
IMPRESSION: Fracture through the base of the fifth metatarsal with mild
distraction.

## 2020-12-16 ENCOUNTER — Other Ambulatory Visit: Payer: Self-pay | Admitting: Family Medicine

## 2020-12-16 DIAGNOSIS — B20 Human immunodeficiency virus [HIV] disease: Secondary | ICD-10-CM

## 2020-12-16 NOTE — Telephone Encounter (Signed)
[facility] B[facility]. Please advise . KH

## 2020-12-19 NOTE — Telephone Encounter (Signed)
Sent pt a my chart message. KH 

## 2021-01-01 ENCOUNTER — Ambulatory Visit: Payer: BC Managed Care – PPO | Admitting: Family Medicine

## 2021-01-01 ENCOUNTER — Other Ambulatory Visit: Payer: Self-pay

## 2021-01-01 VITALS — BP 142/88 | HR 77 | Temp 97.9°F | Wt 260.4 lb

## 2021-01-01 DIAGNOSIS — E669 Obesity, unspecified: Secondary | ICD-10-CM

## 2021-01-01 DIAGNOSIS — Z209 Contact with and (suspected) exposure to unspecified communicable disease: Secondary | ICD-10-CM

## 2021-01-01 DIAGNOSIS — Z23 Encounter for immunization: Secondary | ICD-10-CM

## 2021-01-01 DIAGNOSIS — F341 Dysthymic disorder: Secondary | ICD-10-CM

## 2021-01-01 DIAGNOSIS — J309 Allergic rhinitis, unspecified: Secondary | ICD-10-CM | POA: Diagnosis not present

## 2021-01-01 DIAGNOSIS — B20 Human immunodeficiency virus [HIV] disease: Secondary | ICD-10-CM | POA: Diagnosis not present

## 2021-01-01 DIAGNOSIS — Z79899 Other long term (current) drug therapy: Secondary | ICD-10-CM

## 2021-01-01 MED ORDER — ESCITALOPRAM OXALATE 20 MG PO TABS: 20.0000 mg | ORAL_TABLET | Freq: Every day | ORAL | 3 refills | Status: DC

## 2021-01-01 MED ORDER — BIKTARVY 50-200-25 MG PO TABS: 1.0000 | ORAL_TABLET | Freq: Every day | ORAL | 3 refills | Status: DC

## 2021-01-01 NOTE — Progress Notes (Signed)
   Subjective:    Patient ID: Charles Ho, male    DOB: May 05, 1971, 50 y.o.   MRN: [id number]  HPI He is here for follow-up visit.  He is getting ready to start teaching again.  He states that his allergies are under good control.  He continues on [place] and having no difficulty with that.  He wants to continue on Lexapro which he thinks helps him psychologically.  He has had some recent sexual activity and would like to be STD tested.  He states that he does not think he contacted any STD.  Last activity was about a month ago.  His weight unfortunately has gone up and he states that he is really not made much of an effort to make the changes.  Medical record was reviewed and he does not need his immunizations updated.  He would like another COVID-vaccine.   Review of Systems     Objective:   Physical Exam  Alert and in no distress otherwise not examined      Assessment & Plan:   Human immunodeficiency virus (HIV) disease (HCC) - Plan: CBC with Differential/Platelet, Comprehensive metabolic panel, HIV-1 RNA quant-no reflex-bld, Lipid panel, bictegravir-emtricitabine-tenofovir AF (BIKTARVY) 50-200-25 MG TABS tablet, T-helper cells (CD4) count (not at [facility])  Dysthymia - Plan: escitalopram (LEXAPRO) 20 MG tablet  Immunization, viral disease - Plan: PFIZER Comirnaty(GRAY TOP)COVID-19 Vaccine  Allergic rhinitis, mild  Obesity (BMI 30-39.9) - Plan: Lipid panel  Contact with or exposure to communicable disease - Plan: RPR, GC/Chlamydia Probe Amp  Need for Tdap vaccination - Plan: Tdap vaccine greater than or equal to 7yo IM  Encounter for long-term (current) use of high-risk medication - Plan: CBC with Differential/Platelet, Comprehensive metabolic panel He will continue on his present medication regimen.  Strongly encouraged to make diet and exercise changes as his weight is going in the wrong direction.

## 2021-01-03 LAB — COMPREHENSIVE METABOLIC PANEL
ALT: 32 IU/L (ref 0–44)
AST: 21 IU/L (ref 0–40)
Albumin/Globulin Ratio: 2.1 (ref 1.2–2.2)
Albumin: 4.8 g/dL (ref 3.8–4.9)
Alkaline Phosphatase: 75 IU/L (ref 44–121)
BUN/Creatinine Ratio: 13 (ref 9–20)
BUN: 13 mg/dL (ref 6–24)
Bilirubin Total: 0.4 mg/dL (ref 0.0–1.2)
CO2: 23 mmol/L (ref 20–29)
Calcium: 9.8 mg/dL (ref 8.7–10.2)
Chloride: 100 mmol/L (ref 96–106)
Creatinine, Ser: 1.03 mg/dL (ref 0.76–1.27)
Globulin, Total: 2.3 g/dL (ref 1.5–4.5)
Glucose: 96 mg/dL (ref 65–99)
Potassium: 4.5 mmol/L (ref 3.5–5.2)
Sodium: 140 mmol/L (ref 134–144)
Total Protein: 7.1 g/dL (ref 6.0–8.5)
eGFR: 87 mL/min/{1.73_m2} (ref >59)

## 2021-01-03 LAB — T-HELPER CELLS (CD4) COUNT (NOT AT ARMC)
% CD 4 Pos. Lymph.: 41.7 % (ref 30.8–58.5)
Absolute CD 4 Helper: 1084 /uL (ref 359–1519)
Basophils Absolute: 0.1 10*3/uL (ref 0.0–0.2)
Basos: 1 %
EOS (ABSOLUTE): 0.2 10*3/uL (ref 0.0–0.4)
Eos: 3 %
Hematocrit: 44.5 % (ref 37.5–51.0)
Hemoglobin: 15.7 g/dL (ref 13.0–17.7)
Immature Grans (Abs): 0 10*3/uL (ref 0.0–0.1)
Immature Granulocytes: 0 %
Lymphocytes Absolute: 2.6 10*3/uL (ref 0.7–3.1)
Lymphs: 40 %
MCH: 30.5 pg (ref 26.6–33.0)
MCHC: 35.3 g/dL (ref 31.5–35.7)
MCV: 87 fL (ref 79–97)
Monocytes Absolute: 0.8 10*3/uL (ref 0.1–0.9)
Monocytes: 13 %
Neutrophils Absolute: 2.8 10*3/uL (ref 1.4–7.0)
Neutrophils: 43 %
Platelets: 202 10*3/uL (ref 150–450)
RBC: 5.14 x10E6/uL (ref 4.14–5.80)
RDW: 11.7 % (ref 11.6–15.4)
WBC: 6.4 10*3/uL (ref 3.4–10.8)

## 2021-01-03 LAB — GC/CHLAMYDIA PROBE AMP
Chlamydia trachomatis, NAA: NEGATIVE
Neisseria Gonorrhoeae by PCR: NEGATIVE

## 2021-01-03 LAB — LIPID PANEL
Chol/HDL Ratio: 4.1 ratio (ref 0.0–5.0)
Cholesterol, Total: 203 mg/dL — ABNORMAL HIGH (ref 100–199)
HDL: 49 mg/dL (ref >39)
LDL Chol Calc (NIH): 133 mg/dL — ABNORMAL HIGH (ref 0–99)
Triglycerides: 115 mg/dL (ref 0–149)
VLDL Cholesterol Cal: 21 mg/dL (ref 5–40)

## 2021-01-03 LAB — HIV-1 RNA QUANT-NO REFLEX-BLD: HIV-1 RNA Viral Load: <20 copies/mL

## 2021-01-03 LAB — RPR: RPR Ser Ql: NONREACTIVE

## 2021-05-14 ENCOUNTER — Encounter: Payer: Self-pay | Admitting: Family Medicine

## 2021-05-14 ENCOUNTER — Other Ambulatory Visit: Payer: Self-pay

## 2021-05-14 ENCOUNTER — Telehealth: Payer: BC Managed Care – PPO | Admitting: Family Medicine

## 2021-05-14 VITALS — BP 120/89 | Wt 250.0 lb

## 2021-05-14 DIAGNOSIS — J111 Influenza due to unidentified influenza virus with other respiratory manifestations: Secondary | ICD-10-CM | POA: Diagnosis not present

## 2021-05-14 NOTE — Progress Notes (Signed)
° °  Subjective:    Patient ID: Charles Ho, male    DOB: 15-Mar-1971, 50 y.o.   MRN: [id number]  HPI Documentation for virtual audio and video telecommunications through [place] encounter: The patient was located at home. 2 patient identifiers used.  The provider was located in the office. The patient did consent to this visit and is aware of possible charges through their insurance for this visit. The other persons participating in this telemedicine service were none. Time spent on call was 5 minutes and in review of previous records >18 minutes total for counseling and coordination of care. This virtual service is not related to other E/M service within previous 7 days.  He states that on Sunday he developed a slight cough, sore throat, chills with earache.  This has continued.  No shortness of breath, fever, malaise or fatigue.  He did check for COVID and was negative.  Review of Systems     Objective:   Physical Exam Alert and in no distress otherwise not examined       Assessment & Plan:   Influenza I explained that I thought this was influenza.  He is to use Tylenol for fever aches and pains, Robitussin-DM during the day and NyQuil at night if his cough gets bad.  Gargle with what ever works the best.  At this point it would be wise to stay away from other people for the next several days.  Discussed need to be fever free for at least 24 hours without an antipyretic.  He will keep me informed.

## 2021-11-26 ENCOUNTER — Encounter: Payer: Self-pay | Admitting: Family Medicine

## 2021-11-26 ENCOUNTER — Ambulatory Visit: Payer: BC Managed Care – PPO | Admitting: Family Medicine

## 2021-11-26 VITALS — BP 138/88 | HR 77 | Temp 97.5°F | Ht 71.0 in | Wt 254.2 lb

## 2021-11-26 DIAGNOSIS — B20 Human immunodeficiency virus [HIV] disease: Secondary | ICD-10-CM | POA: Diagnosis not present

## 2021-11-26 DIAGNOSIS — E669 Obesity, unspecified: Secondary | ICD-10-CM

## 2021-11-26 DIAGNOSIS — F341 Dysthymic disorder: Secondary | ICD-10-CM | POA: Diagnosis not present

## 2021-11-26 DIAGNOSIS — J309 Allergic rhinitis, unspecified: Secondary | ICD-10-CM | POA: Diagnosis not present

## 2021-11-26 DIAGNOSIS — Z209 Contact with and (suspected) exposure to unspecified communicable disease: Secondary | ICD-10-CM

## 2021-11-26 MED ORDER — ESCITALOPRAM OXALATE 20 MG PO TABS: 20.0000 mg | ORAL_TABLET | Freq: Every day | ORAL | 3 refills | Status: DC

## 2021-11-26 MED ORDER — BIKTARVY 50-200-25 MG PO TABS: 1.0000 | ORAL_TABLET | Freq: Every day | ORAL | 3 refills | Status: DC

## 2021-11-26 NOTE — Progress Notes (Addendum)
   Subjective:    Patient ID: Charles Ho, male    DOB: 03/20/1971, 51 y.o.   MRN: [id number]  HPI He is here for an interval evaluation.  He continues on [place] and is having no difficulty with that.  His sexual activity is quite limited however he would like to be tested to be safe.  Psychologically he is doing well on his present Lexapro dosing.  His allergies seem to be under good control.  He would like monkeypox vaccination.  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  Smoking and drinking was reviewed.   Review of Systems     Objective:   Physical Exam Alert and in no distress. Tympanic membranes and canals are normal. Pharyngeal area is normal. Neck is supple without adenopathy or thyromegaly. Cardiac exam shows a regular sinus rhythm without murmurs or gallops. Lungs are clear to auscultation.        Assessment & Plan:  Human immunodeficiency virus (HIV) disease (HCC) - Plan: HIV 1 RNA quant-no reflex-bld, CBC with Differential/Platelet, Comprehensive metabolic panel, Lipid panel, bictegravir-emtricitabine-tenofovir AF (BIKTARVY) 50-200-25 MG TABS tablet, CANCELED: T-helper cells (CD4) count (not at [facility])  Dysthymia - Plan: escitalopram (LEXAPRO) 20 MG tablet  Obesity (BMI 30-39.9) - Plan: CBC with Differential/Platelet, Comprehensive metabolic panel, Lipid panel  Allergic rhinitis, mild  Contact with or exposure to communicable disease - Plan: RPR, GC/Chlamydia Probe Amp Encouraged him to continue on his present medication regimen.  Briefly discussed diet and exercise with him however in the past he has really made very little effort to remedy this. Recommend he go to the health department for the monkeypox vaccine

## 2021-11-27 LAB — T-HELPER CELLS (CD4) COUNT (NOT AT ARMC)
% CD 4 Pos. Lymph.: 41.7 % (ref 30.8–58.5)
Absolute CD 4 Helper: 917 /uL (ref 359–1519)
Basophils Absolute: 0 10*3/uL (ref 0.0–0.2)
Basos: 1 %
EOS (ABSOLUTE): 0.2 10*3/uL (ref 0.0–0.4)
Eos: 4 %
Hematocrit: 45.8 % (ref 37.5–51.0)
Hemoglobin: 15.8 g/dL (ref 13.0–17.7)
Immature Grans (Abs): 0 10*3/uL (ref 0.0–0.1)
Immature Granulocytes: 0 %
Lymphocytes Absolute: 2.2 10*3/uL (ref 0.7–3.1)
Lymphs: 43 %
MCH: 30.3 pg (ref 26.6–33.0)
MCHC: 34.5 g/dL (ref 31.5–35.7)
MCV: 88 fL (ref 79–97)
Monocytes Absolute: 0.5 10*3/uL (ref 0.1–0.9)
Monocytes: 10 %
Neutrophils Absolute: 2.2 10*3/uL (ref 1.4–7.0)
Neutrophils: 42 %
Platelets: 204 10*3/uL (ref 150–450)
RBC: 5.21 x10E6/uL (ref 4.14–5.80)
RDW: 11.9 % (ref 11.6–15.4)
WBC: 5.2 10*3/uL (ref 3.4–10.8)

## 2021-11-27 LAB — HIV-1 RNA QUANT-NO REFLEX-BLD: HIV-1 RNA Viral Load: <20 copies/mL

## 2021-11-27 LAB — COMPREHENSIVE METABOLIC PANEL
ALT: 30 IU/L (ref 0–44)
AST: 24 IU/L (ref 0–40)
Albumin/Globulin Ratio: 1.9 (ref 1.2–2.2)
Albumin: 4.6 g/dL (ref 3.8–4.9)
Alkaline Phosphatase: 65 IU/L (ref 44–121)
BUN/Creatinine Ratio: 11 (ref 9–20)
BUN: 12 mg/dL (ref 6–24)
Bilirubin Total: 0.5 mg/dL (ref 0.0–1.2)
CO2: 21 mmol/L (ref 20–29)
Calcium: 9.4 mg/dL (ref 8.7–10.2)
Chloride: 104 mmol/L (ref 96–106)
Creatinine, Ser: 1.07 mg/dL (ref 0.76–1.27)
Globulin, Total: 2.4 g/dL (ref 1.5–4.5)
Glucose: 150 mg/dL — ABNORMAL HIGH (ref 70–99)
Potassium: 4.3 mmol/L (ref 3.5–5.2)
Sodium: 139 mmol/L (ref 134–144)
Total Protein: 7 g/dL (ref 6.0–8.5)
eGFR: 83 mL/min/{1.73_m2} (ref >59)

## 2021-11-27 LAB — RPR: RPR Ser Ql: NONREACTIVE

## 2021-11-27 LAB — LIPID PANEL
Chol/HDL Ratio: 4.3 ratio (ref 0.0–5.0)
Cholesterol, Total: 221 mg/dL — ABNORMAL HIGH (ref 100–199)
HDL: 52 mg/dL (ref >39)
LDL Chol Calc (NIH): 153 mg/dL — ABNORMAL HIGH (ref 0–99)
Triglycerides: 88 mg/dL (ref 0–149)
VLDL Cholesterol Cal: 16 mg/dL (ref 5–40)

## 2021-11-30 LAB — GC/CHLAMYDIA PROBE AMP
Chlamydia trachomatis, NAA: NEGATIVE
Neisseria Gonorrhoeae by PCR: NEGATIVE

## 2021-12-07 ENCOUNTER — Other Ambulatory Visit: Payer: Self-pay | Admitting: Family Medicine

## 2021-12-07 DIAGNOSIS — B20 Human immunodeficiency virus [HIV] disease: Secondary | ICD-10-CM

## 2021-12-07 NOTE — Telephone Encounter (Signed)
[facility]  b[facility]vy. Looks as if it was filled last week. Please advise [facility]

## 2022-01-27 ENCOUNTER — Encounter: Payer: Self-pay | Admitting: Internal Medicine

## 2022-03-02 ENCOUNTER — Encounter: Payer: Self-pay | Admitting: Internal Medicine

## 2022-03-15 ENCOUNTER — Encounter: Payer: Self-pay | Admitting: Internal Medicine

## 2022-06-09 ENCOUNTER — Ambulatory Visit: Payer: BC Managed Care – PPO | Admitting: Family Medicine

## 2022-06-10 ENCOUNTER — Ambulatory Visit: Payer: BC Managed Care – PPO | Admitting: Family Medicine

## 2022-12-06 ENCOUNTER — Other Ambulatory Visit: Payer: Self-pay | Admitting: Family Medicine

## 2022-12-06 DIAGNOSIS — B20 Human immunodeficiency virus [HIV] disease: Secondary | ICD-10-CM

## 2023-01-16 ENCOUNTER — Other Ambulatory Visit: Payer: Self-pay | Admitting: Family Medicine

## 2023-01-16 DIAGNOSIS — F341 Dysthymic disorder: Secondary | ICD-10-CM

## 2023-03-10 ENCOUNTER — Other Ambulatory Visit: Payer: Self-pay | Admitting: Medical

## 2023-03-10 ENCOUNTER — Encounter: Payer: Self-pay | Admitting: Internal Medicine

## 2023-03-10 DIAGNOSIS — B20 Human immunodeficiency virus [HIV] disease: Secondary | ICD-10-CM

## 2023-06-06 ENCOUNTER — Other Ambulatory Visit: Payer: Self-pay | Admitting: Family Medicine

## 2023-06-06 DIAGNOSIS — B20 Human immunodeficiency virus [HIV] disease: Secondary | ICD-10-CM

## 2023-06-06 NOTE — Telephone Encounter (Signed)
 Called pt, no answer, left voicemail.

## 2023-06-08 NOTE — Telephone Encounter (Signed)
 Called pt, no answer, left voicemail. Will send MyChart message.

## 2023-06-16 ENCOUNTER — Ambulatory Visit: Payer: Self-pay | Admitting: Family Medicine

## 2023-06-19 DIAGNOSIS — J301 Allergic rhinitis due to pollen: Secondary | ICD-10-CM | POA: Insufficient documentation

## 2023-06-21 ENCOUNTER — Ambulatory Visit: Payer: 59 | Admitting: Family Medicine

## 2023-06-21 ENCOUNTER — Encounter: Payer: Self-pay | Admitting: Family Medicine

## 2023-06-21 VITALS — BP 130/80 | HR 76 | Ht 70.0 in | Wt 252.0 lb

## 2023-06-21 DIAGNOSIS — Z0000 Encounter for general adult medical examination without abnormal findings: Secondary | ICD-10-CM | POA: Diagnosis not present

## 2023-06-21 DIAGNOSIS — E669 Obesity, unspecified: Secondary | ICD-10-CM | POA: Diagnosis not present

## 2023-06-21 DIAGNOSIS — F341 Dysthymic disorder: Secondary | ICD-10-CM

## 2023-06-21 DIAGNOSIS — Z23 Encounter for immunization: Secondary | ICD-10-CM | POA: Diagnosis not present

## 2023-06-21 DIAGNOSIS — N529 Male erectile dysfunction, unspecified: Secondary | ICD-10-CM

## 2023-06-21 DIAGNOSIS — Z209 Contact with and (suspected) exposure to unspecified communicable disease: Secondary | ICD-10-CM

## 2023-06-21 DIAGNOSIS — B20 Human immunodeficiency virus [HIV] disease: Secondary | ICD-10-CM

## 2023-06-21 DIAGNOSIS — J301 Allergic rhinitis due to pollen: Secondary | ICD-10-CM

## 2023-06-21 DIAGNOSIS — J309 Allergic rhinitis, unspecified: Secondary | ICD-10-CM | POA: Diagnosis not present

## 2023-06-21 DIAGNOSIS — Z79899 Other long term (current) drug therapy: Secondary | ICD-10-CM

## 2023-06-21 LAB — LIPID PANEL

## 2023-06-21 MED ORDER — ESCITALOPRAM OXALATE 20 MG PO TABS: 20.0000 mg | ORAL_TABLET | Freq: Every day | ORAL | 3 refills | Status: DC

## 2023-06-21 MED ORDER — TADALAFIL 20 MG PO TABS: 20.0000 mg | ORAL_TABLET | Freq: Every day | ORAL | 5 refills | Status: DC | PRN

## 2023-06-21 MED ORDER — DOXYCYCLINE HYCLATE 100 MG PO TABS: ORAL_TABLET | ORAL | 0 refills | Status: AC

## 2023-06-21 MED ORDER — BIKTARVY 50-200-25 MG PO TABS: 1.0000 | ORAL_TABLET | Freq: Every day | ORAL | 1 refills | Status: DC

## 2023-06-21 NOTE — Progress Notes (Signed)
Complete physical exam  Patient: Charles Ho   DOB: December 09, 1970   53 y.o. Male  MRN: [id number]  Subjective:    Chief Complaint  Patient presents with   Follow-up    Patient is here for labs and vaccines. Would also like STD check and to discuss weightloss.     Charles Ho is a 53 y.o. male who presents today for a complete physical exam.  He reports consuming a general diet.  Works out twice weekly and daily walking.  He generally feels well. He reports sleeping well.  His work continues to do well.  He has become more socially engaged and is interested in Doxy prep.  He continues on [place] as well as Lexapro and having no difficulty with that.  He would like a refill on his Cialis.  His allergies seem to be under good control.  Most recent fall risk assessment:    06/21/2023    8:30 AM  Fall Risk   Falls in the past year? 0  Number falls in past yr: 0  Injury with Fall? 0  Risk for fall due to : No Fall Risks  Follow up Falls evaluation completed     Most recent depression screenings:    06/21/2023    8:30 AM 11/26/2021   10:45 AM  PHQ 2/9 Scores  PHQ - 2 Score 0 2  PHQ- 9 Score 2 9    Vision:will schedule    Immunization History  Administered Date(s) Administered   Influenza Split 03/30/2011, 03/27/2012   Influenza, Seasonal, Injecte, Preservative Fre 06/21/2023   Influenza,inj,Quad PF,6+ Mos 03/27/2013, 04/29/2014, 05/05/2015, 04/19/2016, 03/28/2017, 03/17/2020   PFIZER Comirnaty(Gray Top)Covid-19 Tri-Sucrose Vaccine 01/01/2021   PFIZER(Purple Top)SARS-COV-2 Vaccination 07/28/2019, 08/18/2019, 03/17/2020   Pfizer(Comirnaty)Fall Seasonal Vaccine 12 years and older 06/21/2023   Pneumococcal Conjugate-13 03/28/2017   Pneumococcal Polysaccharide-23 12/29/2018   Tdap 03/30/2011, 01/01/2021    Health Maintenance  Topic Date Due   Zoster Vaccines- Shingrix (1 of 2) Never done   Fecal DNA (Cologuard)  05/21/2023   COVID-19 Vaccine (6 - 2024-25 season)  08/16/2023   Pneumococcal Vaccine 56-35 Years old (3 of 3 - PPSV23 or PCV20) 12/29/2023   DTaP/Tdap/Td (3 - Td or Tdap) 01/02/2031   INFLUENZA VACCINE  Completed   Hepatitis C Screening  Completed   HIV Screening  Completed   HPV VACCINES  Aged Out    Patient Care Team: Charles Nian, MD as PCP - General (Family Medicine)  Optho:looking for one Dentist:Dr. Viviann Ho Colon:Cologuard    Outpatient Medications Prior to Visit  Medication Sig Note   fluticasone (FLONASE) 50 MCG/ACT nasal spray INSTILL 2 SPRAYS INTO EACH NOSTRIL ONCE DAILY 06/21/2023: As needed   Loratadine 10 MG CAPS Take 1 capsule by mouth daily.    Probiotic Product (PROBIOTIC & ACIDOPHILUS EX ST) CAPS Take by mouth.   06/21/2023: As needed   Ascorbic Acid (VITAMIN C) 1000 MG tablet Take 500 mg by mouth daily.  (Patient not taking: Reported on 06/21/2023)    b complex vitamins tablet Take 1 tablet by mouth daily. (Patient not taking: Reported on 06/21/2023)    cholecalciferol (VITAMIN D) 1000 UNITS tablet Take 1,000 Units by mouth daily.  (Patient not taking: Reported on 06/21/2023)    tadalafil (CIALIS) 20 MG tablet Take 1 tablet (20 mg total) by mouth daily as needed for erectile dysfunction. (Patient not taking: Reported on 03/17/2020) 06/21/2023: Never filled   [DISCONTINUED] BIKTARVY 50-200-25 MG TABS tablet TAKE 1 TABLET  BY MOUTH 1 TIME A DAY    [DISCONTINUED] escitalopram (LEXAPRO) 20 MG tablet TAKE 1 TABLET DAILY    [DISCONTINUED] oxyCODONE-acetaminophen (PERCOCET/ROXICET) 5-325 MG tablet Take 1 tablet by mouth every 6 (six) hours as needed for severe pain. (Patient not taking: Reported on 03/17/2020)    No facility-administered medications prior to visit.    Review of Systems  All other systems reviewed and are negative.   Family and social history as well as health maintenance and immunizations was reviewed.     Objective:    BP 130/80   Pulse 76   Ht 5\' 10"  (1.778 m)   Wt 252 lb (114.3 kg)   BMI 36.16  kg/m    Physical Exam  Alert and in no distress. Tympanic membranes and canals are normal. Pharyngeal area is normal. Neck is supple without adenopathy or thyromegaly. Cardiac exam shows a regular sinus rhythm without murmurs or gallops. Lungs are clear to auscultation.       Assessment & Plan:    Discussed health benefits of physical activity, and encouraged him to engage in regular exercise appropriate for his age and condition.  Routine general medical examination at a health care facility - Plan: CBC with Differential/Platelet, Comprehensive metabolic panel, Lipid panel, Ambulatory referral to Ophthalmology  Allergic rhinitis, mild  Dysthymia - Plan: escitalopram (LEXAPRO) 20 MG tablet  Human immunodeficiency virus (HIV) disease (HCC) - Plan: CBC with Differential/Platelet, Comprehensive metabolic panel, HIV-1 RNA quant-no reflex-bld, Lipid panel, T-helper cells (CD4) count (not at [facility]), bictegravir-emtricitabine-tenofovir AF (BIKTARVY) 50-200-25 MG TABS tablet  Obesity (BMI 30-39.9)  Encounter for long-term (current) use of high-risk medication - Plan: CBC with Differential/Platelet, Comprehensive metabolic panel, HIV-1 RNA quant-no reflex-bld, Lipid panel, T-helper cells (CD4) count (not at [facility])  Need for influenza vaccination - Plan: Flu vaccine trivalent PF, 6mos and older(Flulaval,Afluria,Fluarix,Fluzone)  Allergic rhinitis due to pollen, unspecified seasonality  Need for COVID-19 vaccine - Plan: Pfizer Comirnaty Covid -19 Vaccine 60yrs and older  Contact with or exposure to communicable disease - Plan: doxycycline (VIBRA-TABS) 100 MG tablet, GC/Chlamydia Probe Amp, RPR   I will refer to ophthalmology as it has been quite sometime since he has seen one. Present medication regimen.  Also discussed STD prevention and need for follow-up when he becomes more sexually active.  Charles Gowda, MD

## 2023-06-22 ENCOUNTER — Encounter: Payer: Self-pay | Admitting: Family Medicine

## 2023-06-22 LAB — T-HELPER CELLS (CD4) COUNT (NOT AT ARMC)
% CD 4 Pos. Lymph.: 42.9 % (ref 30.8–58.5)
Absolute CD 4 Helper: 1030 /uL (ref 359–1519)
Basophils Absolute: 0.1 10*3/uL (ref 0.0–0.2)
Basos: 1 %
EOS (ABSOLUTE): 0.3 10*3/uL (ref 0.0–0.4)
Eos: 5 %
Hematocrit: 44.3 % (ref 37.5–51.0)
Hemoglobin: 15.6 g/dL (ref 13.0–17.7)
Immature Grans (Abs): 0 10*3/uL (ref 0.0–0.1)
Immature Granulocytes: 0 %
Lymphocytes Absolute: 2.4 10*3/uL (ref 0.7–3.1)
Lymphs: 38 %
MCH: 31.5 pg (ref 26.6–33.0)
MCHC: 35.2 g/dL (ref 31.5–35.7)
MCV: 90 fL (ref 79–97)
Monocytes Absolute: 0.6 10*3/uL (ref 0.1–0.9)
Monocytes: 9 %
Neutrophils Absolute: 2.9 10*3/uL (ref 1.4–7.0)
Neutrophils: 47 %
Platelets: 198 10*3/uL (ref 150–450)
RBC: 4.95 x10E6/uL (ref 4.14–5.80)
RDW: 11.9 % (ref 11.6–15.4)
WBC: 6.2 10*3/uL (ref 3.4–10.8)

## 2023-06-22 LAB — COMPREHENSIVE METABOLIC PANEL
ALT: 25 IU/L (ref 0–44)
AST: 19 IU/L (ref 0–40)
Albumin: 4.4 g/dL (ref 3.8–4.9)
Alkaline Phosphatase: 59 IU/L (ref 44–121)
BUN/Creatinine Ratio: 10 (ref 9–20)
BUN: 10 mg/dL (ref 6–24)
Bilirubin Total: 0.4 mg/dL (ref 0.0–1.2)
CO2: 22 mmol/L (ref 20–29)
Calcium: 9.2 mg/dL (ref 8.7–10.2)
Chloride: 104 mmol/L (ref 96–106)
Creatinine, Ser: 1 mg/dL (ref 0.76–1.27)
Globulin, Total: 2.1 g/dL (ref 1.5–4.5)
Glucose: 144 mg/dL — ABNORMAL HIGH (ref 70–99)
Potassium: 4.6 mmol/L (ref 3.5–5.2)
Sodium: 140 mmol/L (ref 134–144)
Total Protein: 6.5 g/dL (ref 6.0–8.5)
eGFR: 89 mL/min/{1.73_m2} (ref >59)

## 2023-06-22 LAB — RPR

## 2023-06-22 LAB — LIPID PANEL
Cholesterol, Total: 216 mg/dL — ABNORMAL HIGH (ref 100–199)
HDL: 55 mg/dL (ref >39)
LDL CALC COMMENT:: 3.9 ratio (ref 0.0–5.0)
LDL Chol Calc (NIH): 144 mg/dL — ABNORMAL HIGH (ref 0–99)
Triglycerides: 98 mg/dL (ref 0–149)
VLDL Cholesterol Cal: 17 mg/dL (ref 5–40)

## 2023-06-22 LAB — HIV-1 RNA QUANT-NO REFLEX-BLD

## 2023-06-23 MED ORDER — SILDENAFIL CITRATE 100 MG PO TABS: 100.0000 mg | ORAL_TABLET | Freq: Every day | ORAL | 5 refills | Status: DC | PRN

## 2023-06-23 NOTE — Addendum Note (Signed)
Addended by: Ronnald Nian on: 06/23/2023 08:22 AM   Modules accepted: Orders

## 2023-06-24 LAB — GC/CHLAMYDIA PROBE AMP
Chlamydia trachomatis, NAA: NEGATIVE
Neisseria Gonorrhoeae by PCR: NEGATIVE

## 2023-07-06 ENCOUNTER — Other Ambulatory Visit: Payer: 59

## 2023-07-07 ENCOUNTER — Other Ambulatory Visit (INDEPENDENT_AMBULATORY_CARE_PROVIDER_SITE_OTHER): Payer: 59

## 2023-07-07 DIAGNOSIS — R7309 Other abnormal glucose: Secondary | ICD-10-CM | POA: Diagnosis not present

## 2023-07-07 LAB — POCT GLYCOSYLATED HEMOGLOBIN (HGB A1C): Hemoglobin A1C: 6.6 % — AB (ref 4.0–5.6)

## 2023-07-08 ENCOUNTER — Encounter: Payer: Self-pay | Admitting: Family Medicine

## 2023-07-08 ENCOUNTER — Encounter: Payer: Self-pay | Admitting: Internal Medicine

## 2023-07-19 ENCOUNTER — Encounter: Payer: Self-pay | Admitting: Internal Medicine

## 2023-12-01 ENCOUNTER — Other Ambulatory Visit: Payer: Self-pay | Admitting: Family Medicine

## 2023-12-01 DIAGNOSIS — B20 Human immunodeficiency virus [HIV] disease: Secondary | ICD-10-CM

## 2024-01-17 ENCOUNTER — Telehealth: Payer: Self-pay | Admitting: Family Medicine

## 2024-01-17 NOTE — Telephone Encounter (Signed)
 Copied from CRM [id number]. Topic: General - Other >> Jan 17, 2024  3:55 PM Wess RAMAN wrote: Reason for CRM: Patient upset that he did not receive a call letting him know that he had diabetes since February. He stated he received a mychart message instead and he preferred a voicemail. I offered him an appt to come in and see his provider as well as get labs done. He stated he doesn't have time due to him working 12-14 hours daily and and only getting paid for 8. I also offered to put in a lab order request so that he may take care of of his labs at a different draw station during his free time.  Callback #: [id number]

## 2024-01-18 ENCOUNTER — Telehealth: Payer: Self-pay | Admitting: Family Medicine

## 2024-01-18 NOTE — Telephone Encounter (Signed)
 Called patient, reached voice mail, lmtrc needs diabetes visit.

## 2024-01-18 NOTE — Telephone Encounter (Signed)
 Called patient, reached voice mail.  Dr. Joyce wants patient to come in for Diabetes visit.  Will also send MY CHART message.

## 2024-02-08 ENCOUNTER — Ambulatory Visit (INDEPENDENT_AMBULATORY_CARE_PROVIDER_SITE_OTHER): Admitting: Family Medicine

## 2024-02-08 ENCOUNTER — Encounter: Payer: Self-pay | Admitting: Family Medicine

## 2024-02-08 VITALS — BP 130/74 | HR 90 | Ht 70.5 in | Wt 248.2 lb

## 2024-02-08 DIAGNOSIS — E669 Obesity, unspecified: Secondary | ICD-10-CM

## 2024-02-08 DIAGNOSIS — E1169 Type 2 diabetes mellitus with other specified complication: Secondary | ICD-10-CM

## 2024-02-08 DIAGNOSIS — E1165 Type 2 diabetes mellitus with hyperglycemia: Secondary | ICD-10-CM

## 2024-02-08 DIAGNOSIS — E291 Testicular hypofunction: Secondary | ICD-10-CM

## 2024-02-08 DIAGNOSIS — Z23 Encounter for immunization: Secondary | ICD-10-CM

## 2024-02-08 DIAGNOSIS — Z0000 Encounter for general adult medical examination without abnormal findings: Secondary | ICD-10-CM

## 2024-02-08 DIAGNOSIS — Z1211 Encounter for screening for malignant neoplasm of colon: Secondary | ICD-10-CM

## 2024-02-08 DIAGNOSIS — J301 Allergic rhinitis due to pollen: Secondary | ICD-10-CM | POA: Diagnosis not present

## 2024-02-08 DIAGNOSIS — B20 Human immunodeficiency virus [HIV] disease: Secondary | ICD-10-CM

## 2024-02-08 DIAGNOSIS — S20219A Contusion of unspecified front wall of thorax, initial encounter: Secondary | ICD-10-CM

## 2024-02-08 DIAGNOSIS — N529 Male erectile dysfunction, unspecified: Secondary | ICD-10-CM

## 2024-02-08 DIAGNOSIS — F341 Dysthymic disorder: Secondary | ICD-10-CM | POA: Diagnosis not present

## 2024-02-08 DIAGNOSIS — Z79899 Other long term (current) drug therapy: Secondary | ICD-10-CM

## 2024-02-08 DIAGNOSIS — E785 Hyperlipidemia, unspecified: Secondary | ICD-10-CM

## 2024-02-08 LAB — LIPID PANEL

## 2024-02-08 LAB — POCT GLYCOSYLATED HEMOGLOBIN (HGB A1C): Hemoglobin A1C: 6.6 % — AB (ref 4.0–5.6)

## 2024-02-08 MED ORDER — BIKTARVY 50-200-25 MG PO TABS: 1.0000 | ORAL_TABLET | Freq: Every day | ORAL | 1 refills | Status: AC

## 2024-02-08 MED ORDER — SILDENAFIL CITRATE 100 MG PO TABS: 100.0000 mg | ORAL_TABLET | Freq: Every day | ORAL | 5 refills | Status: DC | PRN

## 2024-02-08 MED ORDER — ROSUVASTATIN CALCIUM 20 MG PO TABS: 20.0000 mg | ORAL_TABLET | Freq: Every day | ORAL | 3 refills | Status: AC

## 2024-02-08 MED ORDER — TIRZEPATIDE 2.5 MG/0.5ML ~~LOC~~ SOAJ: 2.5000 mg | SUBCUTANEOUS | 1 refills | Status: DC

## 2024-02-08 MED ORDER — ESCITALOPRAM OXALATE 20 MG PO TABS: 20.0000 mg | ORAL_TABLET | Freq: Every day | ORAL | 3 refills | Status: AC

## 2024-02-08 NOTE — Progress Notes (Signed)
 Subjective:    Patient ID: Charles Ho, male    DOB: April 12, 1971, 53 y.o.   MRN: [id number]  Discussed the use of AI scribe software for clinical note transcription with the patient, who gave verbal consent to proceed.  History of Present Illness   Charles Ho is a 53 year old male who presents for management of newly diagnosed diabetes and complete exam  He was informed of his diabetes diagnosis via a message in his chart in February, which he did not read until returning to school at the end of August. He was unaware of the diagnosis until then and had assumed his previous test results were normal. He has not yet started any treatment for diabetes.  He has a sore tongue that has persisted for a few weeks. He is unsure if it is related to his use of doxycycline , which he took after a sexual encounter. He recalls his mother using a special mouthwash during chemotherapy and wonders if a less harsh mouthwash might help.  Three weeks ago, he sustained an injury while climbing out of a window at school, resulting in bruises and pain in his solar plexus area. The pain is less severe than initially but still significant, especially when lying down. He is concerned about the possibility of a rib fracture.  He is currently taking Biktarvy  without issues, doxycycline  as needed, Lexapro  for psychological support, Flonase  and Claritin for allergies, and occasionally probiotics. He has not refilled his Viagra  prescription as he has not needed it.  He mentions a past issue with low testosterone  while living in [place] , which was managed with injections. He has not had his testosterone  levels checked recently.  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           Review of Systems  All other systems reviewed and are negative.      Objective:    Physical Exam Physical Exam     Alert and in no distress. Tympanic membranes and canals are normal. Pharyngeal  area is normal. Neck is supple without adenopathy or thyromegaly. Cardiac exam shows a regular sinus rhythm without murmurs or gallops. Lungs are clear to auscultation.     Hemic hemoglobin A1c is 6.6       Assessment & Plan:     Type 2 diabetes mellitus Newly diagnosed Type 2 diabetes mellitus. Unaware due to missed message. - Prescribe Mounjaro  for diabetes management and weight loss. - Educate on Mediterranean diet and regular exercise. - Instruct to call in a month with weight update. - Plan follow-up in four months. - Discuss foot care and eye care related to diabetes. - Encourage use of the American Diabetes Association website for information. He is to call me in 1 month to let me know if he is having any side effects of the medication and what his weight loss is. Obesity Obesity contributing to diabetes. - Prescribe Mounjaro  to assist with weight loss. - Encourage Mediterranean diet and regular exercise.  Hyperlipidemia Hyperlipidemia requiring treatment to lower LDL cholesterol. - Prescribe Crestor  for hyperlipidemia management.  Human immunodeficiency virus (HIV) disease HIV disease well-managed on Biktarvy . - Continue Biktarvy . - Renew prescription for Biktarvy .  Depression Depression managed with Lexapro . Ran out recently. - Renew Lexapro  prescription. - Ensure timely refills to avoid running out.  Allergic rhinitis due to pollen Allergic rhinitis managed with Flonase  and Claritin. - Continue Flonase  and Claritin as needed.  Soft tissue injury of chest  wall Soft tissue injury from climbing incident. Pain improving. - Monitor for improvement, no immediate intervention required.  Sore tongue Sore tongue present for a few weeks. - Examine tongue. - Advise use of gentle mouthwash.  General Health Maintenance Discussed general health maintenance including vaccinations and screenings. - Administer Shingrix  vaccine. - Administer pneumonia vaccine. - Order  Cologuard for colon cancer screening.

## 2024-02-09 ENCOUNTER — Telehealth: Payer: Self-pay

## 2024-02-09 LAB — COMPREHENSIVE METABOLIC PANEL WITH GFR
ALT: 25 IU/L (ref 0–44)
AST: 23 IU/L (ref 0–40)
Albumin: 4.6 g/dL (ref 3.8–4.9)
Alkaline Phosphatase: 66 IU/L (ref 47–123)
BUN/Creatinine Ratio: 16 (ref 9–20)
BUN: 15 mg/dL (ref 6–24)
Bilirubin Total: 0.3 mg/dL (ref 0.0–1.2)
CO2: 22 mmol/L (ref 20–29)
Calcium: 9.7 mg/dL (ref 8.7–10.2)
Chloride: 104 mmol/L (ref 96–106)
Creatinine, Ser: 0.95 mg/dL (ref 0.76–1.27)
Globulin, Total: 2.3 g/dL (ref 1.5–4.5)
Glucose: 143 mg/dL — AB (ref 70–99)
Potassium: 4.1 mmol/L (ref 3.5–5.2)
Sodium: 140 mmol/L (ref 134–144)
Total Protein: 6.9 g/dL (ref 6.0–8.5)
eGFR: 95 mL/min/1.73 (ref >59)

## 2024-02-09 LAB — LIPID PANEL
Cholesterol, Total: 184 mg/dL (ref 100–199)
HDL: 44 mg/dL (ref >39)
LDL CALC COMMENT:: 4.2 ratio (ref 0.0–5.0)
LDL Chol Calc (NIH): 114 mg/dL — AB (ref 0–99)
Triglycerides: 149 mg/dL (ref 0–149)
VLDL Cholesterol Cal: 26 mg/dL (ref 5–40)

## 2024-02-09 LAB — T-HELPER CELLS (CD4) COUNT (NOT AT ARMC)
% CD 4 Pos. Lymph.: 45.1 % (ref 30.8–58.5)
Absolute CD 4 Helper: 1398 /uL (ref 359–1519)
Basophils Absolute: 0.1 x10E3/uL (ref 0.0–0.2)
Basos: 1 %
EOS (ABSOLUTE): 0.2 x10E3/uL (ref 0.0–0.4)
Eos: 3 %
Hematocrit: 44.4 % (ref 37.5–51.0)
Hemoglobin: 14.9 g/dL (ref 13.0–17.7)
Immature Grans (Abs): 0 x10E3/uL (ref 0.0–0.1)
Immature Granulocytes: 0 %
Lymphocytes Absolute: 3.1 x10E3/uL (ref 0.7–3.1)
Lymphs: 47 %
MCH: 30.8 pg (ref 26.6–33.0)
MCHC: 33.6 g/dL (ref 31.5–35.7)
MCV: 92 fL (ref 79–97)
Monocytes Absolute: 0.6 x10E3/uL (ref 0.1–0.9)
Monocytes: 9 %
Neutrophils Absolute: 2.6 x10E3/uL (ref 1.4–7.0)
Neutrophils: 40 %
Platelets: 233 x10E3/uL (ref 150–450)
RBC: 4.83 x10E6/uL (ref 4.14–5.80)
RDW: 11.9 % (ref 11.6–15.4)
WBC: 6.6 x10E3/uL (ref 3.4–10.8)

## 2024-02-09 LAB — TESTOSTERONE: Testosterone: 179 ng/dL — AB (ref 264–916)

## 2024-02-09 LAB — HIV-1 RNA QUANT-NO REFLEX-BLD: HIV-1 RNA Viral Load: <20 {copies}/mL

## 2024-02-09 MED ORDER — TADALAFIL 20 MG PO TABS: 10.0000 mg | ORAL_TABLET | ORAL | 5 refills | Status: AC | PRN

## 2024-02-09 NOTE — Telephone Encounter (Signed)
 His insurance is not covering any Erectile Dysfunction medications. Leita checked and with the [facility] card he can get Sildenafil  100mg  for a little under 25$. So I just called to let him know and emailed the GoodRx info to him.

## 2024-02-09 NOTE — Telephone Encounter (Signed)
 Sildenafil  100 mg is not covered under patients insurance. Pharmacy states that Tadalafil  tab, Vardenafil tab, or Avanafil tab are covered.

## 2024-02-09 NOTE — Telephone Encounter (Signed)
 Okay. Thank you.

## 2024-02-11 ENCOUNTER — Ambulatory Visit: Payer: Self-pay | Admitting: Family Medicine

## 2024-02-13 ENCOUNTER — Other Ambulatory Visit (HOSPITAL_COMMUNITY): Payer: Self-pay

## 2024-02-13 ENCOUNTER — Telehealth: Payer: Self-pay

## 2024-02-13 NOTE — Telephone Encounter (Signed)
 Pharmacy Patient Advocate Encounter   Received notification from Pts Pharmacy that prior authorization for MOUNJARO ) 2.5 MG/0.5ML Pen  is required/requested.   Insurance verification completed.   The patient is insured through [facility] .   Per test claim: PA required; PA submitted to above mentioned insurance via Latent Key/confirmation #/EOC [id number]  Status is pending

## 2024-02-14 ENCOUNTER — Other Ambulatory Visit (HOSPITAL_COMMUNITY): Payer: Self-pay

## 2024-02-14 NOTE — Telephone Encounter (Signed)
 Pharmacy Patient Advocate Encounter  Received notification from [facility] that Prior Authorization for Mounjaro  2.5MG /0.5ML auto-injectors has been APPROVED from 02/13/2024 to 02/13/2027.   PA #/Case ID/Reference #: [id number]

## 2024-02-26 LAB — COLOGUARD: COLOGUARD: NEGATIVE

## 2024-03-06 ENCOUNTER — Ambulatory Visit: Payer: Self-pay

## 2024-03-06 NOTE — Telephone Encounter (Signed)
 FYI Only or Action Required?: FYI only for provider.  Patient was last seen in primary care on 02/08/2024 by Joyce Norleen BROCKS, MD.  Called Nurse Triage reporting Abdominal Pain.  Symptoms began several days ago.  Interventions attempted: Rest, hydration, or home remedies.  Symptoms are: gradually worsening.  Triage Disposition: See Physician Within 24 Hours  Patient/caregiver understands and will follow disposition?: Yes  Copied from CRM #[id number]. Topic: Clinical - Red Word Triage >> Mar 06, 2024  3:59 PM Fonda T wrote: Red Word that prompted transfer to Nurse Triage: Patient calling, states he has been having increased and worsening nausea, having episodes of vomiting for the past week. Patient reports increased full body weakness, unable to eat anything, and terrible stomach pain. Reason for Disposition  [1] MODERATE pain (e.g., interferes with normal activities) AND [2] pain comes and goes (cramps) AND [3] present > 24 hours  (Exception: Pain with Vomiting or Diarrhea - see that Guideline.)  Answer Assessment - Initial Assessment Questions 1. LOCATION: Where does it hurt?      All over  2. RADIATION: Does the pain shoot anywhere else? (e.g., chest, back)     Does not radiate  3. ONSET: When did the pain begin? (Minutes, hours or days ago)      Days  4. SUDDEN: Gradual or sudden onset?     Gradual onset  5. PATTERN Does the pain come and go, or is it constant?     Comes and goes  6. SEVERITY: How bad is the pain?  (e.g., Scale 1-10; mild, moderate, or severe)     Moderate  7. RECURRENT SYMPTOM: Have you ever had this type of stomach pain before? If Yes, ask: When was the last time? and What happened that time?      No  8. CAUSE: What do you think is causing the stomach pain? (e.g., gallstones, recent abdominal surgery)     Unsure of cause  9. RELIEVING/AGGRAVATING FACTORS: What makes it better or worse? (e.g., antacids, bending or twisting motion,  bowel movement)     No relieving or aggravating factors  10. OTHER SYMPTOMS: Do you have any other symptoms? (e.g., back pain, diarrhea, fever, urination pain, vomiting)       Diarrhea, burping  Answer Assessment - Initial Assessment Questions 1. NAUSEA SEVERITY: How bad is the nausea? (e.g., mild, moderate, severe; dehydration, weight loss)     Moderate  2. ONSET: When did the nausea begin?     Last week Thursday and Friday  3. VOMITING: Any vomiting? If Yes, ask: How many times today?     Yes, dry heaves and unable to keep anything down  4. RECURRENT SYMPTOM: Have you had nausea before? If Yes, ask: When was the last time? What happened that time?     No  5. CAUSE: What do you think is causing the nausea?     Unsure  Protocols used: Abdominal Pain - Male-A-AH, Nausea-A-AH

## 2024-03-07 ENCOUNTER — Ambulatory Visit: Admitting: Family Medicine

## 2024-03-07 ENCOUNTER — Encounter: Payer: Self-pay | Admitting: Family Medicine

## 2024-03-07 VITALS — BP 118/70 | HR 94 | Ht 70.5 in | Wt 240.8 lb

## 2024-03-07 DIAGNOSIS — R112 Nausea with vomiting, unspecified: Secondary | ICD-10-CM | POA: Diagnosis not present

## 2024-03-07 DIAGNOSIS — Z23 Encounter for immunization: Secondary | ICD-10-CM

## 2024-03-07 MED ORDER — ONDANSETRON HCL 4 MG PO TABS: 4.0000 mg | ORAL_TABLET | Freq: Three times a day (TID) | ORAL | 0 refills | Status: AC | PRN

## 2024-03-07 NOTE — Telephone Encounter (Signed)
 Called Pt he is coming in today to be seen.

## 2024-03-07 NOTE — Progress Notes (Signed)
   Subjective:    Patient ID: Charles Ho, male    DOB: 12-08-1970, 53 y.o.   MRN: [id number]  Discussed the use of AI scribe software for clinical note transcription with the patient, who gave verbal consent to proceed.  History of Present Illness   Charles Ho is a 53 year old male who presents with nausea, vomiting, and diarrhea. He experienced nausea, vomiting, and diarrhea following his recent Mounjaro  injection, administered on Sunday night. Symptoms began on Monday with nausea and worsened by Tuesday, including vomiting and diarrhea. By Wednesday, he noted some improvement after taking Pepto Bismol the previous night.  He has been on Monjaro for three weeks, with no issues during the first week. In the second week, after introducing rosuvastatin , he experienced nausea and had to leave school early. He took Thursday off and felt better over the weekend. However, symptoms recurred on Monday after his Mounjaro  shot.  He describes the diarrhea as severe, requiring him to use a trash can for vomiting while on the toilet. SABRA He also reports breaking rosuvastatin  tablets into smaller pieces due to difficulty swallowing pills.  He has a history of rib fractures from a fall a few weeks ago, which still causes tenderness in the area.  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 Reports nausea, vomiting, diarrhea, and abdominal cramps.           Review of Systems     Objective:    Physical Exam  Alert and in no distress.  Cardiac exam shows regular rhythm without murmurs or gallops.  Abdominal exam shows decreased bowel sounds without masses or tenderness.           Assessment & Plan:  Assessment and Plan    Nausea, vomiting, and diarrhea Acute symptoms post-Mounjaro  injection, improved with Pepto Bismol. Possible Mounjaro  side effects or viral gastroenteritis. - Administer next Mounjaro  injection as scheduled on  Sunday. - Monitor for symptom recurrence post-injection. - Prescribe Zofran 4 mg as needed for nausea, up to every 8 hours. If ineffective, take a second tablet. - Report symptoms via MyChart if he recurs post-injection.  Immunization Routine immunizations discussed and agreed upon.

## 2024-04-10 ENCOUNTER — Other Ambulatory Visit: Payer: Self-pay | Admitting: Family Medicine

## 2024-04-10 DIAGNOSIS — E1165 Type 2 diabetes mellitus with hyperglycemia: Secondary | ICD-10-CM

## 2024-04-11 NOTE — Telephone Encounter (Signed)
 He has lost 20 pounds

## 2024-05-07 ENCOUNTER — Telehealth: Payer: Self-pay

## 2024-05-07 NOTE — Telephone Encounter (Unsigned)
 Copied from CRM #[id number]. Topic: Clinical - Prescription Issue >> May 07, 2024  3:57 PM Darshell M wrote: Reason for CRM: Patient following up to see if provider wants to increase tirzepatide  dosage before he picks up refill. Patient CB# [phone].

## 2024-05-08 MED ORDER — TIRZEPATIDE 5 MG/0.5ML ~~LOC~~ SOAJ: 5.0000 mg | SUBCUTANEOUS | 0 refills | Status: DC

## 2024-05-09 ENCOUNTER — Other Ambulatory Visit: Payer: Self-pay

## 2024-05-09 MED ORDER — TIRZEPATIDE 5 MG/0.5ML ~~LOC~~ SOAJ: 5.0000 mg | SUBCUTANEOUS | 0 refills | Status: AC

## 2024-06-06 ENCOUNTER — Ambulatory Visit: Payer: Self-pay | Admitting: Family Medicine

## 2024-06-06 ENCOUNTER — Encounter: Payer: Self-pay | Admitting: Family Medicine

## 2024-06-06 VITALS — BP 110/86 | HR 99 | Ht 69.5 in | Wt 222.8 lb

## 2024-06-06 DIAGNOSIS — B20 Human immunodeficiency virus [HIV] disease: Secondary | ICD-10-CM

## 2024-06-06 DIAGNOSIS — Z23 Encounter for immunization: Secondary | ICD-10-CM

## 2024-06-06 DIAGNOSIS — E291 Testicular hypofunction: Secondary | ICD-10-CM

## 2024-06-06 DIAGNOSIS — E669 Obesity, unspecified: Secondary | ICD-10-CM

## 2024-06-06 DIAGNOSIS — J301 Allergic rhinitis due to pollen: Secondary | ICD-10-CM

## 2024-06-06 DIAGNOSIS — Z79899 Other long term (current) drug therapy: Secondary | ICD-10-CM

## 2024-06-06 DIAGNOSIS — E1165 Type 2 diabetes mellitus with hyperglycemia: Secondary | ICD-10-CM

## 2024-06-06 DIAGNOSIS — F341 Dysthymic disorder: Secondary | ICD-10-CM

## 2024-06-06 LAB — POCT GLYCOSYLATED HEMOGLOBIN (HGB A1C): Hemoglobin A1C: 6 % — AB (ref 4.0–5.6)

## 2024-06-06 NOTE — Patient Instructions (Signed)
 ,

## 2024-06-06 NOTE — Progress Notes (Signed)
" ° °  Subjective:    Patient ID: Charles Ho, male    DOB: Feb 17, 1971, 54 y.o.   MRN: [id number]  Discussed the use of AI scribe software for clinical note transcription with the patient, who gave verbal consent to proceed.  History of Present Illness   Charles Ho is a 54 year old male who presents for follow-up on Mounjaro  treatment for weight loss.  He has been on Mounjaro  for his diabetes and weight loss, initially starting with a dose of 2.5 mg. During the first five weeks, he experienced significant nausea and diarrhea, which affected his ability to keep food down. After this period, his body adjusted, and he was able to maintain his food intake, especially during the holiday season of Thanksgiving and Christmas.  He recently transitioned to a 5 mg dose of Mounjaro . He reports that he has not yet experienced any adverse effects from the increased dosage. He has lost approximately 18 pounds since starting the medication and notes a decrease in appetite and 'food noise', meaning he no longer constantly thinks about his next meal.  He does not smoke and reports minimal alcohol consumption, having only had a glass of champagne at Christmas. He accidentally wasted his last dose of 2.5 mg by not removing the lid before administration, but he had already received his 5 mg supply and started it without skipping a dose.           Review of Systems     Objective:    Physical Exam Alert and in no distress.  Hemoglobin A1c is 6.0             Assessment & Plan:      Obesity Transitioned to Mounjaro  5 mg with effective appetite suppression and 18-pound weight loss. No adverse effects reported. - Continue Mounjaro  5 mg dose. - Follow-up in four months.    Type 2 diabetes mellitus with hyperglycemia, without long-term current use of insulin (HCC) - Plan: POCT glycosylated hemoglobin (Hb A1C)    Need for shingles vaccine - Plan: Varicella-zoster vaccine IM, CANCELED:  Varicella-zoster vaccine subcutaneous     "

## 2024-06-08 ENCOUNTER — Other Ambulatory Visit (HOSPITAL_COMMUNITY): Payer: Self-pay

## 2024-06-11 ENCOUNTER — Other Ambulatory Visit (HOSPITAL_COMMUNITY): Payer: Self-pay

## 2024-06-12 ENCOUNTER — Other Ambulatory Visit (HOSPITAL_COMMUNITY): Payer: Self-pay

## 2024-10-10 ENCOUNTER — Ambulatory Visit: Payer: Self-pay | Admitting: Family Medicine
